# Patient Record
Sex: Female | Born: 1995 | Race: Asian | Hispanic: No | Marital: Married
Health system: Southern US, Community
[De-identification: ages and names within clinical notes are randomized; demographics above are authoritative.]

## PROBLEM LIST (undated history)

## (undated) DIAGNOSIS — Z789 Other specified health status: Secondary | ICD-10-CM

## (undated) DIAGNOSIS — O139 Gestational [pregnancy-induced] hypertension without significant proteinuria, unspecified trimester: Secondary | ICD-10-CM

## (undated) HISTORY — PX: NO PAST SURGERIES: SHX2092

## (undated) HISTORY — DX: Gestational (pregnancy-induced) hypertension without significant proteinuria, unspecified trimester: O13.9

## (undated) HISTORY — DX: Other specified health status: Z78.9

---

## 2019-08-17 ENCOUNTER — Other Ambulatory Visit: Payer: Self-pay

## 2019-08-17 ENCOUNTER — Encounter (HOSPITAL_COMMUNITY): Payer: Self-pay

## 2019-08-17 ENCOUNTER — Inpatient Hospital Stay (HOSPITAL_COMMUNITY)
Admission: AD | Admit: 2019-08-17 | Discharge: 2019-08-17 | Disposition: A | Discharge status: Home / Self Care | Payer: Medicaid Other | Attending: Obstetrics and Gynecology | Admitting: Obstetrics and Gynecology

## 2019-08-17 DIAGNOSIS — Z3202 Encounter for pregnancy test, result negative: Secondary | ICD-10-CM | POA: Diagnosis not present

## 2019-08-17 LAB — POCT PREGNANCY, URINE: Preg Test, Ur: NEGATIVE

## 2019-08-17 NOTE — MAU Note (Signed)
Pt complaining of vaginal bleeding that started at 3 pm. States it was less than a period. Having leg and abdominal pain as medium. Also having nausea and dizziness.

## 2019-08-17 NOTE — MAU Provider Note (Signed)
Mia Porter is a 23 y.o. G1P0  who presents to MAU today for vaginal bleeding, pregnancy sx, and missed menses (LMP 10/7). She has not taken a pregnancy test.   BP 129/85 (BP Location: Right Arm)   Pulse 88   Resp 18   Ht 5\' 5"  (1.651 m)   Wt 71.4 kg   LMP 07/01/2019   SpO2 100% Comment: room air  BMI 26.21 kg/m   CONSTITUTIONAL: Well-developed, well-nourished female in no acute distress.  MUSCULOSKELETAL: Normal range of motion.  CARDIOVASCULAR: Regular heart rate RESPIRATORY: Normal effort NEUROLOGICAL: Alert and oriented to person, place, and time.  SKIN: No pallor. PSYCH: Normal mood and affect. Normal behavior. Normal judgment and thought content.  Results for orders placed or performed during the hospital encounter of 08/17/19 (from the past 24 hour(s))  Pregnancy, urine POC     Status: None   Collection Time: 08/17/19  4:25 PM  Result Value Ref Range   Preg Test, Ur NEGATIVE NEGATIVE   MDM No signs of pregnancy. Discussed findings with pt. VB is likely a late menses.   A: Negative pregnancy test  P: Discharge home Follow up with OBGYN as needed- info provided for [place] Patient may return to MAU for OB emergencies as needed or if her condition were to change or worsen   Julianne Handler, [place]  08/17/2019 6:05 PM

## 2019-09-25 NOTE — L&D Delivery Note (Signed)
Delivery Note Labor onset:   06/09/20 Labor Onset Time: 1600 Complete dilation at 8:20 PM  Onset of pushing at 2044 FHR second stage Cat 1 Analgesia/Anesthesia intrapartum: Epidural  Open-glottis pushing with maternal urge. Delivery of a viable female at 2117. Fetal head delivered in OA position. Nuchal cord x1 loose delivered via somersault manuever. Infant placed on maternal abd, dried, and tactile stim.  Cord double clamped after pulsation ended and cut by father. Father present for birth. Cord blood sample collected Yes Arterial cord blood sample N/A.  Placenta delivered Tomasa Blase, intact, with 3 VC.  Placenta to pathology for GHTN. Uterine tone firm bleeding scant  1st degree laceration identified.  Anesthesia: Epidural Repair: 2-0 Vicryl CT EBL (mL): 100 Complications: none APGAR: APGAR (1 MIN): 8   APGAR (5 MINS): 9   APGAR (10 MINS):   Mom to postpartum.  Baby to Couplet care / Skin to Skin   Parents decline circumcision.  Roma Schanz MSN, CNM 06/09/2020, 9:44 PM

## 2019-12-17 ENCOUNTER — Other Ambulatory Visit (HOSPITAL_COMMUNITY): Payer: Self-pay | Admitting: Nurse Practitioner

## 2019-12-17 DIAGNOSIS — Z3A13 13 weeks gestation of pregnancy: Secondary | ICD-10-CM

## 2019-12-17 DIAGNOSIS — Z3682 Encounter for antenatal screening for nuchal translucency: Secondary | ICD-10-CM

## 2019-12-21 ENCOUNTER — Encounter (HOSPITAL_COMMUNITY): Payer: Self-pay | Admitting: *Deleted

## 2019-12-24 ENCOUNTER — Other Ambulatory Visit (HOSPITAL_COMMUNITY): Payer: Self-pay | Admitting: Nurse Practitioner

## 2019-12-24 ENCOUNTER — Ambulatory Visit (HOSPITAL_COMMUNITY): Payer: Medicaid Other | Admitting: *Deleted

## 2019-12-24 ENCOUNTER — Ambulatory Visit (HOSPITAL_COMMUNITY)
Admission: RE | Admit: 2019-12-24 | Discharge: 2019-12-24 | Disposition: A | Discharge status: Home / Self Care | Payer: Medicaid Other | Source: Ambulatory Visit | Attending: Nurse Practitioner | Admitting: Nurse Practitioner

## 2019-12-24 ENCOUNTER — Ambulatory Visit (HOSPITAL_COMMUNITY): Payer: Medicaid Other

## 2019-12-24 ENCOUNTER — Encounter (HOSPITAL_COMMUNITY): Payer: Self-pay

## 2019-12-24 ENCOUNTER — Other Ambulatory Visit: Payer: Self-pay

## 2019-12-24 VITALS — BP 126/80 | HR 100 | Temp 97.1°F | Ht 63.5 in | Wt 152.4 lb

## 2019-12-24 DIAGNOSIS — Z3682 Encounter for antenatal screening for nuchal translucency: Secondary | ICD-10-CM

## 2019-12-24 DIAGNOSIS — Z3A13 13 weeks gestation of pregnancy: Secondary | ICD-10-CM | POA: Diagnosis not present

## 2019-12-31 LAB — OB RESULTS CONSOLE HEPATITIS B SURFACE ANTIGEN: Hepatitis B Surface Ag: NEGATIVE

## 2020-01-27 ENCOUNTER — Other Ambulatory Visit: Payer: Self-pay | Admitting: Obstetrics and Gynecology

## 2020-01-27 DIAGNOSIS — Z363 Encounter for antenatal screening for malformations: Secondary | ICD-10-CM

## 2020-01-27 LAB — OB RESULTS CONSOLE GC/CHLAMYDIA
Chlamydia: NEGATIVE
Gonorrhea: NEGATIVE

## 2020-02-16 ENCOUNTER — Other Ambulatory Visit: Payer: Self-pay | Admitting: *Deleted

## 2020-02-16 ENCOUNTER — Other Ambulatory Visit: Payer: Self-pay | Admitting: Obstetrics and Gynecology

## 2020-02-16 ENCOUNTER — Ambulatory Visit: Payer: Medicaid Other | Attending: Obstetrics and Gynecology

## 2020-02-16 ENCOUNTER — Other Ambulatory Visit: Payer: Self-pay

## 2020-02-16 DIAGNOSIS — Z3A20 20 weeks gestation of pregnancy: Secondary | ICD-10-CM | POA: Diagnosis not present

## 2020-02-16 DIAGNOSIS — Z363 Encounter for antenatal screening for malformations: Secondary | ICD-10-CM

## 2020-02-16 DIAGNOSIS — O365920 Maternal care for other known or suspected poor fetal growth, second trimester, not applicable or unspecified: Secondary | ICD-10-CM | POA: Diagnosis not present

## 2020-03-08 ENCOUNTER — Other Ambulatory Visit: Payer: Self-pay | Admitting: *Deleted

## 2020-03-08 ENCOUNTER — Other Ambulatory Visit: Payer: Self-pay

## 2020-03-08 ENCOUNTER — Other Ambulatory Visit: Payer: Self-pay | Admitting: Obstetrics

## 2020-03-08 ENCOUNTER — Ambulatory Visit: Payer: Medicaid Other | Admitting: *Deleted

## 2020-03-08 ENCOUNTER — Ambulatory Visit: Payer: Medicaid Other | Attending: Obstetrics and Gynecology

## 2020-03-08 VITALS — BP 117/75 | HR 95

## 2020-03-08 DIAGNOSIS — O365990 Maternal care for other known or suspected poor fetal growth, unspecified trimester, not applicable or unspecified: Secondary | ICD-10-CM

## 2020-03-08 DIAGNOSIS — O322XX0 Maternal care for transverse and oblique lie, not applicable or unspecified: Secondary | ICD-10-CM | POA: Diagnosis not present

## 2020-03-08 DIAGNOSIS — O0990 Supervision of high risk pregnancy, unspecified, unspecified trimester: Secondary | ICD-10-CM

## 2020-03-08 DIAGNOSIS — O365920 Maternal care for other known or suspected poor fetal growth, second trimester, not applicable or unspecified: Secondary | ICD-10-CM | POA: Diagnosis not present

## 2020-03-08 DIAGNOSIS — Z362 Encounter for other antenatal screening follow-up: Secondary | ICD-10-CM | POA: Diagnosis not present

## 2020-03-08 DIAGNOSIS — Z3A23 23 weeks gestation of pregnancy: Secondary | ICD-10-CM | POA: Diagnosis not present

## 2020-03-22 ENCOUNTER — Other Ambulatory Visit: Payer: Self-pay | Admitting: *Deleted

## 2020-03-22 ENCOUNTER — Ambulatory Visit: Payer: Medicaid Other | Admitting: *Deleted

## 2020-03-22 ENCOUNTER — Other Ambulatory Visit: Payer: Self-pay

## 2020-03-22 ENCOUNTER — Ambulatory Visit: Payer: Medicaid Other | Attending: Obstetrics and Gynecology

## 2020-03-22 VITALS — BP 116/81 | HR 81

## 2020-03-22 DIAGNOSIS — O365990 Maternal care for other known or suspected poor fetal growth, unspecified trimester, not applicable or unspecified: Secondary | ICD-10-CM | POA: Insufficient documentation

## 2020-03-22 DIAGNOSIS — O365920 Maternal care for other known or suspected poor fetal growth, second trimester, not applicable or unspecified: Secondary | ICD-10-CM

## 2020-03-22 DIAGNOSIS — O0990 Supervision of high risk pregnancy, unspecified, unspecified trimester: Secondary | ICD-10-CM

## 2020-03-22 DIAGNOSIS — Z3A25 25 weeks gestation of pregnancy: Secondary | ICD-10-CM

## 2020-03-29 ENCOUNTER — Other Ambulatory Visit: Payer: Self-pay

## 2020-03-29 ENCOUNTER — Ambulatory Visit: Payer: Medicaid Other | Admitting: *Deleted

## 2020-03-29 ENCOUNTER — Ambulatory Visit: Payer: Medicaid Other | Attending: Obstetrics and Gynecology

## 2020-03-29 VITALS — BP 119/81 | HR 86

## 2020-03-29 DIAGNOSIS — O365990 Maternal care for other known or suspected poor fetal growth, unspecified trimester, not applicable or unspecified: Secondary | ICD-10-CM | POA: Diagnosis present

## 2020-03-29 DIAGNOSIS — Z3A26 26 weeks gestation of pregnancy: Secondary | ICD-10-CM | POA: Diagnosis not present

## 2020-03-29 DIAGNOSIS — Z362 Encounter for other antenatal screening follow-up: Secondary | ICD-10-CM | POA: Diagnosis not present

## 2020-03-29 DIAGNOSIS — O365920 Maternal care for other known or suspected poor fetal growth, second trimester, not applicable or unspecified: Secondary | ICD-10-CM | POA: Diagnosis present

## 2020-03-30 LAB — OB RESULTS CONSOLE HIV ANTIBODY (ROUTINE TESTING): HIV: NONREACTIVE

## 2020-04-05 ENCOUNTER — Ambulatory Visit: Payer: Medicaid Other | Attending: Family

## 2020-04-05 ENCOUNTER — Ambulatory Visit: Payer: Self-pay

## 2020-04-05 DIAGNOSIS — Z23 Encounter for immunization: Secondary | ICD-10-CM

## 2020-04-05 NOTE — Progress Notes (Signed)
   Covid-19 Vaccination Clinic  Name:  Mia Porter    MRN: [id number] DOB: [birth year]  04/05/2020  Mia Porter was observed post Covid-19 immunization for 15 minutes without incident. She was provided with Vaccine Information Sheet and instruction to access the V-Safe system.   Mia Porter was instructed to call 911 with any severe reactions post vaccine: Marland Kitchen Difficulty breathing  . Swelling of face and throat  . A fast heartbeat  . A bad rash all over body  . Dizziness and weakness   Immunizations Administered    Name Date Dose VIS Date Route   Moderna COVID-19 Vaccine 04/05/2020  2:53 PM 0.5 mL 08/2019 Intramuscular   Manufacturer: Moderna   Lot: [id number]   NDC: 62229-798-92

## 2020-04-06 ENCOUNTER — Ambulatory Visit: Payer: Medicaid Other

## 2020-04-19 ENCOUNTER — Other Ambulatory Visit: Payer: Self-pay | Admitting: *Deleted

## 2020-04-19 ENCOUNTER — Ambulatory Visit: Payer: Medicaid Other | Admitting: *Deleted

## 2020-04-19 ENCOUNTER — Ambulatory Visit: Payer: Medicaid Other | Attending: Obstetrics and Gynecology

## 2020-04-19 ENCOUNTER — Other Ambulatory Visit: Payer: Self-pay

## 2020-04-19 VITALS — BP 124/85 | HR 64

## 2020-04-19 DIAGNOSIS — Z362 Encounter for other antenatal screening follow-up: Secondary | ICD-10-CM | POA: Diagnosis not present

## 2020-04-19 DIAGNOSIS — O365930 Maternal care for other known or suspected poor fetal growth, third trimester, not applicable or unspecified: Secondary | ICD-10-CM

## 2020-04-19 DIAGNOSIS — Z3A29 29 weeks gestation of pregnancy: Secondary | ICD-10-CM

## 2020-04-19 DIAGNOSIS — O365920 Maternal care for other known or suspected poor fetal growth, second trimester, not applicable or unspecified: Secondary | ICD-10-CM | POA: Diagnosis present

## 2020-05-03 ENCOUNTER — Ambulatory Visit: Payer: Medicaid Other | Attending: Internal Medicine

## 2020-05-03 DIAGNOSIS — Z23 Encounter for immunization: Secondary | ICD-10-CM

## 2020-05-03 NOTE — Progress Notes (Signed)
   Covid-19 Vaccination Clinic  Name:  Mia Porter    MRN: [id number] DOB: 09-08-1996  05/03/2020  [name] was observed post Covid-19 immunization for 15 minutes without incident. She was provided with Vaccine Information Sheet and instruction to access the V-Safe system.   [name] was instructed to call 911 with any severe reactions post vaccine: Marland Kitchen Difficulty breathing  . Swelling of face and throat  . A fast heartbeat  . A bad rash all over body  . Dizziness and weakness   Immunizations Administered    Name Date Dose VIS Date Route   Moderna COVID-19 Vaccine 05/03/2020 11:37 AM 0.5 mL 08/2019 Intramuscular   Manufacturer: Moderna   Lot: [id number]   NDC: 36629-476-54

## 2020-05-10 ENCOUNTER — Other Ambulatory Visit: Payer: Self-pay | Admitting: *Deleted

## 2020-05-10 ENCOUNTER — Ambulatory Visit: Payer: Medicaid Other | Attending: Obstetrics and Gynecology

## 2020-05-10 ENCOUNTER — Encounter: Payer: Self-pay | Admitting: *Deleted

## 2020-05-10 ENCOUNTER — Ambulatory Visit: Payer: Medicaid Other | Admitting: *Deleted

## 2020-05-10 ENCOUNTER — Other Ambulatory Visit: Payer: Self-pay

## 2020-05-10 VITALS — BP 131/82 | HR 84

## 2020-05-10 DIAGNOSIS — Z3A32 32 weeks gestation of pregnancy: Secondary | ICD-10-CM

## 2020-05-10 DIAGNOSIS — Z362 Encounter for other antenatal screening follow-up: Secondary | ICD-10-CM | POA: Diagnosis not present

## 2020-05-10 DIAGNOSIS — O365990 Maternal care for other known or suspected poor fetal growth, unspecified trimester, not applicable or unspecified: Secondary | ICD-10-CM | POA: Insufficient documentation

## 2020-05-10 DIAGNOSIS — O365930 Maternal care for other known or suspected poor fetal growth, third trimester, not applicable or unspecified: Secondary | ICD-10-CM

## 2020-06-02 ENCOUNTER — Other Ambulatory Visit: Payer: Self-pay | Admitting: Obstetrics

## 2020-06-02 ENCOUNTER — Other Ambulatory Visit: Payer: Self-pay | Admitting: *Deleted

## 2020-06-02 ENCOUNTER — Other Ambulatory Visit: Payer: Self-pay

## 2020-06-02 ENCOUNTER — Inpatient Hospital Stay (HOSPITAL_COMMUNITY)
Admission: AD | Admit: 2020-06-02 | Discharge: 2020-06-02 | Disposition: A | Discharge status: Home / Self Care | Payer: Medicaid Other | Attending: Obstetrics and Gynecology | Admitting: Obstetrics and Gynecology

## 2020-06-02 ENCOUNTER — Encounter (HOSPITAL_COMMUNITY): Payer: Self-pay | Admitting: Obstetrics and Gynecology

## 2020-06-02 ENCOUNTER — Ambulatory Visit: Payer: Medicaid Other | Attending: Obstetrics and Gynecology

## 2020-06-02 ENCOUNTER — Ambulatory Visit: Payer: Medicaid Other

## 2020-06-02 DIAGNOSIS — O365990 Maternal care for other known or suspected poor fetal growth, unspecified trimester, not applicable or unspecified: Secondary | ICD-10-CM

## 2020-06-02 DIAGNOSIS — Z362 Encounter for other antenatal screening follow-up: Secondary | ICD-10-CM | POA: Diagnosis not present

## 2020-06-02 DIAGNOSIS — O365930 Maternal care for other known or suspected poor fetal growth, third trimester, not applicable or unspecified: Secondary | ICD-10-CM

## 2020-06-02 DIAGNOSIS — Z3A36 36 weeks gestation of pregnancy: Secondary | ICD-10-CM | POA: Diagnosis not present

## 2020-06-02 DIAGNOSIS — O133 Gestational [pregnancy-induced] hypertension without significant proteinuria, third trimester: Secondary | ICD-10-CM | POA: Insufficient documentation

## 2020-06-02 DIAGNOSIS — O139 Gestational [pregnancy-induced] hypertension without significant proteinuria, unspecified trimester: Secondary | ICD-10-CM | POA: Diagnosis present

## 2020-06-02 LAB — COMPREHENSIVE METABOLIC PANEL
ALT: 15 U/L (ref 0–44)
AST: 19 U/L (ref 15–41)
Albumin: 2.8 g/dL — ABNORMAL LOW (ref 3.5–5.0)
Alkaline Phosphatase: 130 U/L — ABNORMAL HIGH (ref 38–126)
Anion gap: 9 (ref 5–15)
BUN: 5 mg/dL — ABNORMAL LOW (ref 6–20)
CO2: 21 mmol/L — ABNORMAL LOW (ref 22–32)
Calcium: 9.2 mg/dL (ref 8.9–10.3)
Chloride: 105 mmol/L (ref 98–111)
Creatinine, Ser: 0.46 mg/dL (ref 0.44–1.00)
GFR calc Af Amer: >60 mL/min (ref >60)
GFR calc non Af Amer: >60 mL/min (ref >60)
Glucose, Bld: 89 mg/dL (ref 70–99)
Potassium: 4.1 mmol/L (ref 3.5–5.1)
Sodium: 135 mmol/L (ref 135–145)
Total Bilirubin: 0.4 mg/dL (ref 0.3–1.2)
Total Protein: 6.1 g/dL — ABNORMAL LOW (ref 6.5–8.1)

## 2020-06-02 LAB — PROTEIN / CREATININE RATIO, URINE
Creatinine, Urine: 23.7 mg/dL
Total Protein, Urine: <6 mg/dL

## 2020-06-02 LAB — LACTATE DEHYDROGENASE: LDH: 124 U/L (ref 98–192)

## 2020-06-02 LAB — CBC
HCT: 33.3 % — ABNORMAL LOW (ref 36.0–46.0)
Hemoglobin: 10.3 g/dL — ABNORMAL LOW (ref 12.0–15.0)
MCH: 25.5 pg — ABNORMAL LOW (ref 26.0–34.0)
MCHC: 30.9 g/dL (ref 30.0–36.0)
MCV: 82.4 fL (ref 80.0–100.0)
Platelets: 163 10*3/uL (ref 150–400)
RBC: 4.04 MIL/uL (ref 3.87–5.11)
RDW: 18 % — ABNORMAL HIGH (ref 11.5–15.5)
WBC: 6.2 10*3/uL (ref 4.0–10.5)
nRBC: 0 % (ref 0.0–0.2)

## 2020-06-02 LAB — URIC ACID: Uric Acid, Serum: 4.1 mg/dL (ref 2.5–7.1)

## 2020-06-02 LAB — URINALYSIS, ROUTINE W REFLEX MICROSCOPIC
Bilirubin Urine: NEGATIVE
Glucose, UA: NEGATIVE mg/dL
Hgb urine dipstick: NEGATIVE
Ketones, ur: NEGATIVE mg/dL
Leukocytes,Ua: NEGATIVE
Nitrite: NEGATIVE
Protein, ur: NEGATIVE mg/dL
Specific Gravity, Urine: 1.004 — ABNORMAL LOW (ref 1.005–1.030)
pH: 7 (ref 5.0–8.0)

## 2020-06-02 MED ORDER — LACTATED RINGERS IV SOLN: INTRAVENOUS | Status: DC

## 2020-06-02 MED ORDER — LABETALOL HCL 5 MG/ML IV SOLN
INTRAVENOUS | Status: AC
  Administered 2020-06-02: 20 mg via INTRAVENOUS
  Filled 2020-06-02: qty 4

## 2020-06-02 MED ORDER — LABETALOL HCL 5 MG/ML IV SOLN: 20.0000 mg | INTRAVENOUS | Status: DC | PRN

## 2020-06-02 MED ORDER — NIFEDIPINE ER OSMOTIC RELEASE 30 MG PO TB24
30.0000 mg | ORAL_TABLET | Freq: Every day | ORAL | Status: DC
  Administered 2020-06-02: 30 mg via ORAL
  Filled 2020-06-02: qty 1

## 2020-06-02 MED ORDER — LABETALOL HCL 5 MG/ML IV SOLN: 80.0000 mg | INTRAVENOUS | Status: DC | PRN

## 2020-06-02 MED ORDER — BETAMETHASONE SOD PHOS & ACET 6 (3-3) MG/ML IJ SUSP
12.0000 mg | INTRAMUSCULAR | Status: DC
  Administered 2020-06-02: 12 mg via INTRAMUSCULAR
  Filled 2020-06-02: qty 5

## 2020-06-02 MED ORDER — LABETALOL HCL 5 MG/ML IV SOLN: 40.0000 mg | INTRAVENOUS | Status: DC | PRN

## 2020-06-02 MED ORDER — HYDRALAZINE HCL 20 MG/ML IJ SOLN: 10.0000 mg | INTRAMUSCULAR | Status: DC | PRN

## 2020-06-02 MED ORDER — NIFEDIPINE ER 30 MG PO TB24: 30.0000 mg | ORAL_TABLET | Freq: Every day | ORAL | 1 refills | Status: AC

## 2020-06-02 NOTE — MAU Note (Signed)
Sent from MD for BP evaluation.  Denies visual disturbances, visual disturbances, and epigastric pain.  Endorses +FM.

## 2020-06-02 NOTE — Discharge Instructions (Signed)

## 2020-06-02 NOTE — MAU Provider Note (Signed)
History    Mia Porter is a 24 y.o. G1P0 at [redacted]w[redacted]d sent by office for evaluation of elevated BP.  Patient seen by MFM today for growth sono, noted IUGR 9%tile.   Denies HA/NV/RUQ pain/visual changes.   Spouse present and supportive.   CSN: [id number]  Arrival date and time: 06/02/20 1456   None     Chief Complaint  Patient presents with  . BP Evaluation   HPI  OB History    Gravida  1   Para      Term      Preterm      AB      Living  0     SAB      TAB      Ectopic      Multiple      Live Births              Past Medical History:  Diagnosis Date  . Medical history non-contributory     Past Surgical History:  Procedure Laterality Date  . NO PAST SURGERIES      History reviewed. No pertinent family history.  Social History   Tobacco Use  . Smoking status: Never Smoker  . Smokeless tobacco: Never Used  Vaping Use  . Vaping Use: Never used  Substance Use Topics  . Alcohol use: Not Currently  . Drug use: Not Currently    Allergies: No Known Allergies  Medications Prior to Admission  Medication Sig Dispense Refill Last Dose  . ferrous sulfate 325 (65 FE) MG EC tablet Take 325 mg by mouth 3 (three) times daily with meals.   06/02/2020 at Unknown time  . Prenatal Vit w/Fe-Methylfol-FA (PNV PO) Take by mouth.   06/02/2020 at Unknown time    Review of Systems Physical Exam   Patient Vitals for the past 24 hrs:  BP Temp Temp src Pulse Resp SpO2 Height Weight  06/02/20 1956 (!) 151/99 -- -- 97 17 100 % -- --  06/02/20 1930 (!) 143/94 -- -- 87 -- -- -- --  06/02/20 1900 (!) 143/102 -- -- 94 -- -- -- --  06/02/20 1830 (!) 140/103 -- -- 91 -- 99 % -- --  06/02/20 1800 (!) 149/101 -- -- 84 -- -- -- --  06/02/20 1730 (!) 144/96 -- -- 86 -- -- -- --  06/02/20 1713 (!) 148/101 -- -- 82 -- -- -- --  06/02/20 1700 (!) 146/100 -- -- 82 -- -- -- --  06/02/20 1650 (!) 148/99 -- -- 78 -- -- -- --  06/02/20 1640 (!) 146/100 -- -- 84 -- -- -- --   06/02/20 1630 (!) 153/98 -- -- 84 -- -- -- --  06/02/20 1620 (!) 147/99 -- -- 83 -- -- -- --  06/02/20 1610 (!) 155/97 -- -- 81 -- 100 % -- --  06/02/20 1605 (!) 156/106 -- -- 82 -- -- -- --  06/02/20 1545 (!) 150/110 -- -- 89 -- 100 % -- --  06/02/20 1542 (!) 161/107 -- -- 91 -- -- -- --  06/02/20 1518 (!) 148/110 98.6 F (37 C) Oral 93 20 93 % 5\' 4"  (1.626 m) 83.4 kg   Physical Exam  MAU Course  Procedures . Orders Placed This Encounter  Procedures  . CBC    Standing Status:   Standing    Number of Occurrences:   1  . Protein / creatinine ratio, urine    Standing Status:   Standing    Number  of Occurrences:   1  . Urinalysis, Routine w reflex microscopic Urine, Clean Catch    Standing Status:   Standing    Number of Occurrences:   1  . Uric acid    Standing Status:   Standing    Number of Occurrences:   1  . Lactate dehydrogenase    Standing Status:   Standing    Number of Occurrences:   1  . Comprehensive metabolic panel    Standing Status:   Standing    Number of Occurrences:   1  . Notify Physician    Confirmatory reading of BP> 160/110 15 minutes later    Standing Status:   Standing    Number of Occurrences:   1    Order Specific Question:   Notify Physician    Answer:   Temp greater than or equal to 100.4    Order Specific Question:   Notify Physician    Answer:   RR greater than 24 or less than 10    Order Specific Question:   Notify Physician    Answer:   HR greater than 120 or less than 50    Order Specific Question:   Notify Physician    Answer:   SBP greater than 160 mmHG or less than 80 mmHG    Order Specific Question:   Notify Physician    Answer:   DBP greater than 110 mmHG or less than 45 mmHG    Order Specific Question:   Notify Physician    Answer:   Urinary output is less than for any 4 hour period  . Measure blood pressure    20 minutes after giving hydralazine 10 MG IV dose.  Call MD if SBP >/= 160 or DBP >/= 110.    Standing Status:    Standing    Number of Occurrences:   1  . Discharge patient    Order Specific Question:   Discharge disposition    Answer:   01-Home or Self Care [1]    Order Specific Question:   Discharge patient date    Answer:   06/02/2020   Meds ordered this encounter  Medications  . AND Linked Order Group   . labetalol (NORMODYNE) injection 20 mg   . labetalol (NORMODYNE) injection 40 mg   . labetalol (NORMODYNE) injection 80 mg   . hydrALAZINE (APRESOLINE) injection 10 mg  . labetalol (NORMODYNE) 5 MG/ML injection    [place], [place]   : cabinet override  . lactated ringers infusion  . betamethasone acetate-betamethasone sodium phosphate (CELESTONE) injection 12 mg  . NIFEdipine (PROCARDIA-XL/NIFEDICAL-XL) 24 hr tablet 30 mg  . NIFEdipine (ADALAT CC) 30 MG 24 hr tablet    Sig: Take 1 tablet (30 mg total) by mouth daily.    Dispense:  30 tablet    Refill:  1   MDM Patient evaluated for elevated BP w/o neural symptoms. No evidence of preeclampsia at this time.  Fetal heart tracing reactive, category 1  Results for orders placed or performed during the hospital encounter of 06/02/20 (from the past 24 hour(s))  Urinalysis, Routine w reflex microscopic     Status: Abnormal   Collection Time: 06/02/20  3:23 PM  Result Value Ref Range   Color, Urine STRAW (A) YELLOW   APPearance CLEAR CLEAR   Specific Gravity, Urine 1.004 (L) 1.005 - 1.030   pH 7.0 5.0 - 8.0   Glucose, UA NEGATIVE NEGATIVE mg/dL   Hgb urine dipstick NEGATIVE  NEGATIVE   Bilirubin Urine NEGATIVE NEGATIVE   Ketones, ur NEGATIVE NEGATIVE mg/dL   Protein, ur NEGATIVE NEGATIVE mg/dL   Nitrite NEGATIVE NEGATIVE   Leukocytes,Ua NEGATIVE NEGATIVE  CBC     Status: Abnormal   Collection Time: 06/02/20  3:29 PM  Result Value Ref Range   WBC 6.2 4.0 - 10.5 K/uL   RBC 4.04 3.87 - 5.11 MIL/uL   Hemoglobin 10.3 (L) 12.0 - 15.0 g/dL   HCT 08.6 (L) 36 - 46 %   MCV 82.4 80.0 - 100.0 fL   MCH 25.5 (L) 26.0 - 34.0 pg   MCHC 30.9 30.0 -  36.0 g/dL   RDW 76.1 (H) 95.0 - 93.2 %   Platelets 163 150 - 400 K/uL   nRBC 0.0 0.0 - 0.2 %  Protein / creatinine ratio, urine     Status: None   Collection Time: 06/02/20  3:29 PM  Result Value Ref Range   Creatinine, Urine 23.70 mg/dL   Total Protein, Urine <6 mg/dL   Protein Creatinine Ratio        0.00 - 0.15 mg/mg[Cre]  Uric acid     Status: None   Collection Time: 06/02/20  3:37 PM  Result Value Ref Range   Uric Acid, Serum 4.1 2.5 - 7.1 mg/dL  Lactate dehydrogenase     Status: None   Collection Time: 06/02/20  3:37 PM  Result Value Ref Range   LDH 124 98 - 192 U/L  Comprehensive metabolic panel     Status: Abnormal   Collection Time: 06/02/20  3:37 PM  Result Value Ref Range   Sodium 135 135 - 145 mmol/L   Potassium 4.1 3.5 - 5.1 mmol/L   Chloride 105 98 - 111 mmol/L   CO2 21 (L) 22 - 32 mmol/L   Glucose, Bld 89 70 - 99 mg/dL   BUN 5 (L) 6 - 20 mg/dL   Creatinine, Ser 6.71 0.44 - 1.00 mg/dL   Calcium 9.2 8.9 - 24.5 mg/dL   Total Protein 6.1 (L) 6.5 - 8.1 g/dL   Albumin 2.8 (L) 3.5 - 5.0 g/dL   AST 19 15 - 41 U/L   ALT 15 0 - 44 U/L   Alkaline Phosphatase 130 (H) 38 - 126 U/L   Total Bilirubin 0.4 0.3 - 1.2 mg/dL   GFR calc non Af Amer >60 >60 mL/min   GFR calc Af Amer >60 >60 mL/min   Anion gap 9 5 - 15    Assessment and Plan  G1P0 at [redacted]w[redacted]d  Gestational hypertension  BP in mild elevated range, preeclampsia lab workup negative.  Will give betamethasone 2 doses 24 hrs apart for fetal lung maturation, patient to return tomorrow for second dose  Procardia 30 XL daily Scheduled for IOL 06/08/2020  PEC precautions reviewed with patient and spouse. Written information given and all questions answered.  DC home after medications given. F/U with office visit on Monday, patient to call and schedule visit.   POC in consult w/ Dr. Maryan Puls, cnm 06/02/2020, 7:47 PM

## 2020-06-03 ENCOUNTER — Telehealth (HOSPITAL_COMMUNITY): Payer: Self-pay | Admitting: *Deleted

## 2020-06-03 ENCOUNTER — Other Ambulatory Visit: Payer: Self-pay

## 2020-06-03 ENCOUNTER — Inpatient Hospital Stay (HOSPITAL_COMMUNITY)
Admission: AD | Admit: 2020-06-03 | Discharge: 2020-06-03 | Disposition: A | Discharge status: Home / Self Care | Payer: Medicaid Other | Attending: Obstetrics and Gynecology | Admitting: Obstetrics and Gynecology

## 2020-06-03 ENCOUNTER — Encounter (HOSPITAL_COMMUNITY): Payer: Self-pay | Admitting: *Deleted

## 2020-06-03 DIAGNOSIS — O133 Gestational [pregnancy-induced] hypertension without significant proteinuria, third trimester: Secondary | ICD-10-CM | POA: Insufficient documentation

## 2020-06-03 DIAGNOSIS — Z3A37 37 weeks gestation of pregnancy: Secondary | ICD-10-CM | POA: Diagnosis not present

## 2020-06-03 MED ORDER — BETAMETHASONE SOD PHOS & ACET 6 (3-3) MG/ML IJ SUSP
12.0000 mg | Freq: Once | INTRAMUSCULAR | Status: AC
  Administered 2020-06-03: 12 mg via INTRAMUSCULAR

## 2020-06-03 NOTE — Telephone Encounter (Signed)
Preadmission screen  

## 2020-06-07 ENCOUNTER — Other Ambulatory Visit (HOSPITAL_COMMUNITY): Payer: Medicaid Other | Attending: Obstetrics and Gynecology

## 2020-06-08 ENCOUNTER — Encounter (HOSPITAL_COMMUNITY): Payer: Self-pay | Admitting: Obstetrics and Gynecology

## 2020-06-08 ENCOUNTER — Ambulatory Visit: Payer: Medicaid Other

## 2020-06-08 ENCOUNTER — Other Ambulatory Visit: Payer: Self-pay

## 2020-06-08 ENCOUNTER — Inpatient Hospital Stay (HOSPITAL_COMMUNITY)
Admission: AD | Admit: 2020-06-08 | Discharge: 2020-06-11 | DRG: 807 | Disposition: A | Discharge status: Home / Self Care | Payer: Medicaid Other | Attending: Obstetrics & Gynecology | Admitting: Obstetrics & Gynecology

## 2020-06-08 DIAGNOSIS — Z3A37 37 weeks gestation of pregnancy: Secondary | ICD-10-CM | POA: Diagnosis not present

## 2020-06-08 DIAGNOSIS — O99214 Obesity complicating childbirth: Secondary | ICD-10-CM | POA: Diagnosis present

## 2020-06-08 DIAGNOSIS — O134 Gestational [pregnancy-induced] hypertension without significant proteinuria, complicating childbirth: Secondary | ICD-10-CM | POA: Diagnosis present

## 2020-06-08 DIAGNOSIS — E669 Obesity, unspecified: Secondary | ICD-10-CM | POA: Diagnosis present

## 2020-06-08 DIAGNOSIS — O139 Gestational [pregnancy-induced] hypertension without significant proteinuria, unspecified trimester: Secondary | ICD-10-CM | POA: Diagnosis present

## 2020-06-08 DIAGNOSIS — Z20822 Contact with and (suspected) exposure to covid-19: Secondary | ICD-10-CM | POA: Diagnosis present

## 2020-06-08 LAB — COMPREHENSIVE METABOLIC PANEL
ALT: 20 U/L (ref 0–44)
ALT: 18 U/L (ref 0–44)
AST: 22 U/L (ref 15–41)
AST: 21 U/L (ref 15–41)
Albumin: 2.8 g/dL — ABNORMAL LOW (ref 3.5–5.0)
Albumin: 2.5 g/dL — ABNORMAL LOW (ref 3.5–5.0)
Alkaline Phosphatase: 124 U/L (ref 38–126)
Alkaline Phosphatase: 121 U/L (ref 38–126)
Anion gap: 14 (ref 5–15)
Anion gap: 12 (ref 5–15)
BUN: 7 mg/dL (ref 6–20)
BUN: 6 mg/dL (ref 6–20)
CO2: 17 mmol/L — ABNORMAL LOW (ref 22–32)
CO2: 18 mmol/L — ABNORMAL LOW (ref 22–32)
Calcium: 9.9 mg/dL (ref 8.9–10.3)
Calcium: 9.4 mg/dL (ref 8.9–10.3)
Chloride: 105 mmol/L (ref 98–111)
Chloride: 104 mmol/L (ref 98–111)
Creatinine, Ser: 0.6 mg/dL (ref 0.44–1.00)
Creatinine, Ser: 0.54 mg/dL (ref 0.44–1.00)
GFR calc Af Amer: >60 mL/min (ref >60)
GFR calc Af Amer: >60 mL/min (ref >60)
GFR calc non Af Amer: >60 mL/min (ref >60)
GFR calc non Af Amer: >60 mL/min (ref >60)
Glucose, Bld: 71 mg/dL (ref 70–99)
Glucose, Bld: 131 mg/dL — ABNORMAL HIGH (ref 70–99)
Potassium: 4.3 mmol/L (ref 3.5–5.1)
Potassium: 3.6 mmol/L (ref 3.5–5.1)
Sodium: 136 mmol/L (ref 135–145)
Sodium: 134 mmol/L — ABNORMAL LOW (ref 135–145)
Total Bilirubin: 0.4 mg/dL (ref 0.3–1.2)
Total Bilirubin: 0.3 mg/dL (ref 0.3–1.2)
Total Protein: 6.3 g/dL — ABNORMAL LOW (ref 6.5–8.1)
Total Protein: 5.7 g/dL — ABNORMAL LOW (ref 6.5–8.1)

## 2020-06-08 LAB — PROTEIN / CREATININE RATIO, URINE
Creatinine, Urine: 65.01 mg/dL
Protein Creatinine Ratio: 0.28 mg/mg{Cre} — ABNORMAL HIGH (ref 0.00–0.15)
Total Protein, Urine: 18 mg/dL

## 2020-06-08 LAB — URINALYSIS, ROUTINE W REFLEX MICROSCOPIC
Bilirubin Urine: NEGATIVE
Glucose, UA: NEGATIVE mg/dL
Hgb urine dipstick: NEGATIVE
Ketones, ur: NEGATIVE mg/dL
Leukocytes,Ua: NEGATIVE
Nitrite: NEGATIVE
Protein, ur: NEGATIVE mg/dL
Specific Gravity, Urine: 1.008 (ref 1.005–1.030)
pH: 7 (ref 5.0–8.0)

## 2020-06-08 LAB — POCT FERN TEST: POCT Fern Test: NEGATIVE

## 2020-06-08 LAB — GROUP B STREP BY PCR: Group B strep by PCR: NEGATIVE

## 2020-06-08 LAB — CBC
HCT: 36.3 % (ref 36.0–46.0)
Hemoglobin: 11.4 g/dL — ABNORMAL LOW (ref 12.0–15.0)
MCH: 26.3 pg (ref 26.0–34.0)
MCHC: 31.4 g/dL (ref 30.0–36.0)
MCV: 83.8 fL (ref 80.0–100.0)
Platelets: 202 10*3/uL (ref 150–400)
RBC: 4.33 MIL/uL (ref 3.87–5.11)
RDW: 18.6 % — ABNORMAL HIGH (ref 11.5–15.5)
WBC: 7 10*3/uL (ref 4.0–10.5)
nRBC: 0 % (ref 0.0–0.2)

## 2020-06-08 LAB — TYPE AND SCREEN
ABO/RH(D): B POS
Antibody Screen: NEGATIVE

## 2020-06-08 LAB — SARS CORONAVIRUS 2 BY RT PCR (HOSPITAL ORDER, PERFORMED IN ~~LOC~~ HOSPITAL LAB): SARS Coronavirus 2: NEGATIVE

## 2020-06-08 MED ORDER — OXYTOCIN BOLUS FROM INFUSION
333.0000 mL | Freq: Once | INTRAVENOUS | Status: AC
  Administered 2020-06-09: 333 mL via INTRAVENOUS

## 2020-06-08 MED ORDER — FENTANYL CITRATE (PF) 100 MCG/2ML IJ SOLN: 50.0000 ug | INTRAMUSCULAR | Status: DC | PRN

## 2020-06-08 MED ORDER — HYDRALAZINE HCL 20 MG/ML IJ SOLN: 10.0000 mg | INTRAMUSCULAR | Status: DC | PRN

## 2020-06-08 MED ORDER — LABETALOL HCL 5 MG/ML IV SOLN
20.0000 mg | INTRAVENOUS | Status: DC | PRN
  Administered 2020-06-08 – 2020-06-09 (×4): 20 mg via INTRAVENOUS
  Filled 2020-06-08 (×4): qty 4

## 2020-06-08 MED ORDER — NIFEDIPINE ER OSMOTIC RELEASE 30 MG PO TB24
30.0000 mg | ORAL_TABLET | Freq: Every day | ORAL | Status: DC
  Administered 2020-06-08 – 2020-06-09 (×2): 30 mg via ORAL
  Filled 2020-06-08 (×2): qty 1

## 2020-06-08 MED ORDER — HYDROXYZINE HCL 50 MG PO TABS: 50.0000 mg | ORAL_TABLET | Freq: Four times a day (QID) | ORAL | Status: DC | PRN

## 2020-06-08 MED ORDER — LIDOCAINE HCL (PF) 1 % IJ SOLN: 30.0000 mL | INTRAMUSCULAR | Status: DC | PRN

## 2020-06-08 MED ORDER — MISOPROSTOL 50MCG HALF TABLET
50.0000 ug | ORAL_TABLET | ORAL | Status: DC | PRN
  Administered 2020-06-08 – 2020-06-09 (×3): 50 ug via ORAL
  Filled 2020-06-08 (×3): qty 1

## 2020-06-08 MED ORDER — NIFEDIPINE ER OSMOTIC RELEASE 30 MG PO TB24: 30.0000 mg | ORAL_TABLET | Freq: Every day | ORAL | Status: DC

## 2020-06-08 MED ORDER — LABETALOL HCL 5 MG/ML IV SOLN
40.0000 mg | INTRAVENOUS | Status: DC | PRN
  Administered 2020-06-09: 40 mg via INTRAVENOUS
  Filled 2020-06-08 (×2): qty 8

## 2020-06-08 MED ORDER — OXYTOCIN 10 UNIT/ML IJ SOLN: 10.0000 [IU] | Freq: Once | INTRAMUSCULAR | Status: DC

## 2020-06-08 MED ORDER — SOD CITRATE-CITRIC ACID 500-334 MG/5ML PO SOLN: 30.0000 mL | ORAL | Status: DC | PRN

## 2020-06-08 MED ORDER — TERBUTALINE SULFATE 1 MG/ML IJ SOLN: 0.2500 mg | Freq: Once | INTRAMUSCULAR | Status: DC | PRN

## 2020-06-08 MED ORDER — LABETALOL HCL 5 MG/ML IV SOLN
80.0000 mg | INTRAVENOUS | Status: DC | PRN
  Filled 2020-06-08: qty 16

## 2020-06-08 MED ORDER — OXYTOCIN-SODIUM CHLORIDE 30-0.9 UT/500ML-% IV SOLN
2.5000 [IU]/h | INTRAVENOUS | Status: DC
  Filled 2020-06-08: qty 500

## 2020-06-08 MED ORDER — ONDANSETRON HCL 4 MG/2ML IJ SOLN: 4.0000 mg | Freq: Four times a day (QID) | INTRAMUSCULAR | Status: DC | PRN

## 2020-06-08 MED ORDER — LACTATED RINGERS IV SOLN: INTRAVENOUS | Status: DC

## 2020-06-08 MED ORDER — ACETAMINOPHEN 325 MG PO TABS: 650.0000 mg | ORAL_TABLET | ORAL | Status: DC | PRN

## 2020-06-08 MED ORDER — LACTATED RINGERS IV SOLN: 500.0000 mL | INTRAVENOUS | Status: DC | PRN

## 2020-06-08 NOTE — Progress Notes (Signed)
Informed provider of severe range blood pressure at 1831 (163/108). Provider said to recheck before giving Labetalol. BP recheck was 159/107.

## 2020-06-08 NOTE — Progress Notes (Signed)
Labor Progress Note  Subjective: Pt resting quietly in bed with husband at bedside. Reviewed POC, pt denies questions. Pt denies HA, RUQ pain or vision changes. Pt endorses feeling cxt but able to breath through them.  Patient Active Problem List   Diagnosis Date Noted  . Gestational hypertension 06/02/2020   Objective: BP (!) 145/91   Pulse 78   Temp 98.3 F (36.8 C) (Oral)   Resp 18   Wt 79.8 kg   LMP 09/23/2019 (Exact Date)   SpO2 99%   BMI 30.21 kg/m  No intake/output data recorded. No intake/output data recorded. NST: FHR baseline 130 bpm, Variability: moderate, Accelerations:present, Decelerations:  Absent= Cat 1/Reactive CTX:  irregular, every 4-58mins to occ, lasting 20-40 seconds Uterus gravid, soft non tender, mild to palpate with contractions.  SVE:  Dilation:  (pt unable to tolerate SVE) Exam by:: Philemon Kingdom NP  Pt declined vaginal check.  Bedside US verified vertex position.   Assessment:  Mia Porter is a 24 y.o. female, G1P0000, IUP at 37.1 weeks, presenting for IOL for GHTN uncontrolled with medication procardia 30XL mg daily, with severe range BP 160/110s in MAU that required x1 dose IV labetalol, BP repeat was 135/91. Pt denies taking her BP medication today, she endorses taking it at night times.  Pt denies HA, RUQ pain or vision changes. Pt endorse + Fm. Denies vaginal leakage. Denies vaginal bleeding. Denies feeling cxt's. Interpretor used.   Prenatal Problem: Aemia (Hgb 9.6 at 27 weeks, rx'd Fe) Fetal growth restriction (EFW 7%ile at 21 weeks, primarily due to shortenend long bones, normal Panorama. F/U growth at 24 weeks 4.9%ile, started weekly dopplers 6/15, follow growth. RECHECK GROWTH 7/27 21%ILE, RECHECK IN 3 WEEKS AT MFM--EFW 13%ILE. RECHECK IN 3 WEEKS,  EFW 13% on 8/17, BPP 8/8, and Normal S/D ration on dopplers, EFW 5.3lbs 9% on 9/9)  Language difficulty (Husband as translator, speaks Louie Casa ([place]), understands some  English.) Pregnancy-induced hypertension (Dx 9/9, on Procardia 30mg  XL, scheduled for induction 06/09/20, labs negative on 9/9, PCR undetected.)  Declined vaginal exam Patient Active Problem List   Diagnosis Date Noted  . Gestational hypertension 06/02/2020   NICHD: Category 1  Membranes: Intact, no s/s of infection  Induction:    Cytotec x 08/02/2020 PO @ 2221, and 0349  Foley Bulb: N/A  Pitocin - N/A  Pain management:               IV pain management: xPRN             Epidural placement: PRN  GBS Negative  GHTN: BP 145/91, asymptomatic, required x1 dose IV labetalol upon admission @ 1242 on 9/15, neg labs, PCR 0.28,  stable.    Plan: Continue labor plan GHTN: Continue procardia 30mg  XL po daily, IV labetalol for severe range Bps,  Continuous monitoring Rest Ambulate Frequent position changes to facilitate fetal rotation and descent. 10/15 CNM will reassess with cervical exam at 0800 or earlier if necessary Continue Cytotec Q4H PO per protocol due to unable to tolerate vaginal exams.  Anticipate, AROM and pitocin.  Anticipate labor progression and vaginal delivery.  Baby Female: In pt circ desired after birth.   Report to be given to DR Maureen Ralphs Na d CNM at 0700 and they will assume care of pt.   Sallye Ober, NP-C, CNM, MSN 06/09/2020. 4:38 AM

## 2020-06-08 NOTE — MAU Note (Signed)
1215 -  Midwife aware of severe range blood pressures. Advised that patient may be nervous, declined giving medication at this time.  Midwife informed of continued severe range BP. Ordered medication at 1232.  Medication given.  BP after medication 135/91, 149/108. Midwife informed. Pt requested meal. Diet ordered per midwife.

## 2020-06-08 NOTE — MAU Note (Signed)
.   Mia Porter is a 24 y.o. at 107w0d here in MAU reporting: she had a large gush of fluid around 1030. Denies any vaginal bleeding or contractions. +FM  Onset of complaint: 1030 Pain score: 0 Vitals:   06/08/20 1129 06/08/20 1130  BP:  (!) 151/103  Pulse:  98  Resp:  18  Temp:  98.5 F (36.9 C)  SpO2: 100% 98%     FHT:135 Lab orders placed from triage:UA

## 2020-06-08 NOTE — H&P (Signed)
Mia Porter is a 24 y.o. female, G1P0000, IUP at 37.1 weeks, presenting for IOL for GHTN uncontrolled with medication procardia 30XL mg daily, with severe range BP 160/110s in MAU that required x1 dose IV labetalol, BP repeat was 135/91. Pt denies taking her BP medication today, she endorses taking it at night times.  Pt denies HA, RUQ pain or vision changes. Pt endorse + Fm. Denies vaginal leakage. Denies vaginal bleeding. Denies feeling cxt's. Interpretor used.   Prenatal Problem: Aemia (Hgb 9.6 at 27 weeks, rx'd Fe) Fetal growth restriction (EFW 7%ile at 21 weeks, primarily due to shortenend long bones, normal Panorama. F/U growth at 24 weeks 4.9%ile, started weekly dopplers 6/15, follow growth. RECHECK GROWTH 7/27 21%ILE, RECHECK IN 3 WEEKS AT MFM--EFW 13%ILE. RECHECK IN 3 WEEKS,  EFW 13% on 8/17, BPP 8/8, and Normal S/D ration on dopplers, EFW 5.3lbs 9% on 9/9)  Language difficulty (Husband as translator, speaks [place] ([place]), understands some English.) Pregnancy-induced hypertension (Dx 9/9, on Procardia 30mg  XL, scheduled for induction 06/09/20, labs negative on 9/9, PCR undetected.)  Patient Active Problem List   Diagnosis Date Noted  . Gestational hypertension 06/02/2020     Medications Prior to Admission  Medication Sig Dispense Refill Last Dose  . NIFEdipine (ADALAT CC) 30 MG 24 hr tablet Take 1 tablet (30 mg total) by mouth daily. 30 tablet 1   . ferrous sulfate 325 (65 FE) MG EC tablet Take 325 mg by mouth 3 (three) times daily with meals.     . Prenatal Vit w/Fe-Methylfol-FA (PNV PO) Take by mouth.       Past Medical History:  Diagnosis Date  . Medical history non-contributory   . Pregnancy induced hypertension      No current facility-administered medications on file prior to encounter.   Current Outpatient Medications on File Prior to Encounter  Medication Sig Dispense Refill  . ferrous sulfate 325 (65 FE) MG EC tablet Take 325 mg by mouth 3 (three) times daily  with meals.    Marland Kitchen NIFEdipine (ADALAT CC) 30 MG 24 hr tablet Take 1 tablet (30 mg total) by mouth daily. 30 tablet 1  . Prenatal Vit w/Fe-Methylfol-FA (PNV PO) Take by mouth.       No Known Allergies  History of present pregnancy: Pt Info/Preference:  Screening/Consents:  Labs:   EDD: Estimated Date of Delivery: 06/29/20  Establised: Patient's last menstrual period was 09/23/2019 (exact date).  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 VBAC: no RPR:   NR  Circumcision: In pt desired   HBsAg:  Neg  Pediatrician:  ???   HIV:   NR  Prenatal Classes: no Additional [place]:  EFW 13% on 8/17, BPP 8/8, and Normal S/D ration on dopplers GBS:  Pending sent 9/13(For PCN allergy, check sensitivities)       Chlamydia: neg    MFM Referral/Consult:  GC: neg  Support Person: husband   PAP: 2021-normal  Pain Management: epidural Neonatologist Referral:  Hgb Electrophoresis:  AA  Birth Plan: DCC   Hgb NOB: 11.3  28W: 9.6   OB History    Gravida  1   Para      Term      Preterm      AB      Living  0     SAB      TAB      Ectopic      Multiple      Live Births             Past Medical History:  Diagnosis Date  . Medical history non-contributory   . Pregnancy induced hypertension    Past Surgical History:  Procedure Laterality Date  . NO PAST SURGERIES     Family History: family history is not on file. Social History:  reports that she has never smoked. She has never used smokeless tobacco. She reports previous alcohol use. She reports previous drug use.   Prenatal Transfer Tool  Maternal Diabetes:  No Genetic Screening: Normal Maternal Ultrasounds/Referrals: Other:Short long bones Fetal Ultrasounds or other Referrals:  Referred to Materal Fetal Medicine  Maternal Substance Abuse:  No Significant Maternal Medications:  Meds include: Other: Procardia 30 XL mg daily Significant Maternal Lab Results: Other: GBS pending, sent 9/13   ROS:  Review of Systems  Constitutional: Negative.   HENT: Negative.   Eyes: Negative.   Respiratory: Negative.   Cardiovascular: Negative.   Gastrointestinal: Negative.   Genitourinary: Negative.   Musculoskeletal: Negative.   Skin: Negative.   Neurological: Negative.   Endo/Heme/Allergies: Negative.   Psychiatric/Behavioral: Negative.      Physical Exam: LMP 09/23/2019 (Exact Date)   Physical Exam Vitals and nursing note reviewed. Exam conducted with a chaperone present.  Constitutional:      Appearance: Normal appearance.  HENT:     Head: Normocephalic and atraumatic.     Nose: Nose normal.     Mouth/Throat:     Mouth: Mucous membranes are moist.  Eyes:     Extraocular Movements: Extraocular movements intact.     Conjunctiva/sclera: Conjunctivae normal.     Pupils: Pupils are equal, round, and reactive to light.  Cardiovascular:     Rate and Rhythm: Normal rate and regular rhythm.     Pulses: Normal pulses.     Heart sounds: Normal heart sounds.  Pulmonary:     Effort: Pulmonary effort is normal.     Breath sounds: Normal breath sounds.  Abdominal:     General: Bowel sounds are normal.  Genitourinary:    Comments: Gravida uterus , pelvis adequate for vaginal delivery.  Musculoskeletal:        General: Normal range of motion.     Cervical back: Normal range of motion and neck supple.  Skin:    General: Skin is warm.     Capillary Refill: Capillary refill takes less than 2 seconds.  Neurological:     General: No focal deficit present.     Mental Status: She is alert and oriented to person, place, and time.  Psychiatric:         Mood and Affect: Mood normal.      NST: FHR baseline 130 bpm, Variability: moderate, Accelerations:present, Decelerations:  Absent= Cat 1/Reactive UC:   none SVE:  0/30/-3  , vertex verified by fetal sutures. Pt did not tolerate vaginal exam  Leopold's: Position unable to obtain due to unable to tolerate vaginal exam, EFW 5.5lbs via leopold's.  Bedside US verified vertex.  Labs: Results for orders placed or performed during Mia Porter encounter of 06/08/20 (  from Mia past 24 hour(s))  CBC     Status: Abnormal   Collection Time: 06/08/20 12:22 PM  Result Value Ref Range   WBC 7.0 4.0 - 10.5 K/uL   RBC 4.33 3.87 - 5.11 MIL/uL   Hemoglobin 11.4 (L) 12.0 - 15.0 g/dL   HCT 16.1 36 - 46 %   MCV 83.8 80.0 - 100.0 fL   MCH 26.3 26.0 - 34.0 pg   MCHC 31.4 30.0 - 36.0 g/dL   RDW 09.6 (H) 04.5 - 40.9 %   Platelets 202 150 - 400 K/uL   nRBC 0.0 0.0 - 0.2 %  Comprehensive metabolic panel     Status: Abnormal   Collection Time: 06/08/20 12:22 PM  Result Value Ref Range   Sodium 136 135 - 145 mmol/L   Potassium 4.3 3.5 - 5.1 mmol/L   Chloride 105 98 - 111 mmol/L   CO2 17 (L) 22 - 32 mmol/L   Glucose, Bld 71 70 - 99 mg/dL   BUN 7 6 - 20 mg/dL   Creatinine, Ser 8.11 0.44 - 1.00 mg/dL   Calcium 9.9 8.9 - 91.4 mg/dL   Total Protein 6.3 (L) 6.5 - 8.1 g/dL   Albumin 2.8 (L) 3.5 - 5.0 g/dL   AST 22 15 - 41 U/L   ALT 20 0 - 44 U/L   Alkaline Phosphatase 124 38 - 126 U/L   Total Bilirubin 0.4 0.3 - 1.2 mg/dL   GFR calc non Af Amer >60 >60 mL/min   GFR calc Af Amer >60 >60 mL/min   Anion gap 14 5 - 15    Imaging:  [place] MFM FETAL BPP WO NON STRESS  Result Date: 06/02/2020 ----------------------------------------------------------------------  OBSTETRICS REPORT                       (Signed Final 06/02/2020 09:23 am) ---------------------------------------------------------------------- Patient Info  ID #:       [id number]                          D.O.B.:  1995/12/26 (24 yrs)  Name:       Mia [facility]  [facility]                Visit Date: 06/02/2020 07:57 am ---------------------------------------------------------------------- Performed By  Attending:        Noralee Space MD        Ref. Address:     [facility]                                                             [facility]  [address]                                                             [address]                                                             [place], [place]                                                             [address]  Performed By:     Percell Boston          Location[facility]  Referred By:      Osborn Coho MD ---------------------------------------------------------------------- Orders  #  Description                           Code        Ordered By  1  [place] MFM OB FOLLOW UP                   76816.01    YU FANG  2  [place] MFM FETAL BPP WO NON               60454.09    YU FANG     STRESS  3  [place] MFM UA CORD DOPPLER                [id number]    YU FANG ----------------------------------------------------------------------  #  Order #                     Accession #                Episode #  1  [id number]                   [id number]                 [id number]  2  [id number]                   [id number]                 [id number]  3  [id number]                   [id number]                 [id number] ---------------------------------------------------------------------- Indications  Maternal care for known or suspected poor      O36.5930  fetal growth, third trimester, not applicable or   unspecified IUGR  [redacted] weeks gestation of pregnancy                Z3A.36  Encounter for other antenatal screening        Z36.2  follow-up  Low Risk NIPS(Negative Horizon) ---------------------------------------------------------------------- Fetal Evaluation  Num Of Fetuses:         1  Cardiac Activity:       Observed  Presentation:           Cephalic  Placenta:               Anterior w/ post left succ lobe  P. Cord Insertion:      Previously Visualized  Amniotic Fluid  AFI FV:      Within normal limits  AFI Sum(cm)     %Tile       Largest Pocket(cm)  12.29           39          3.72  RUQ(cm)       RLQ(cm)       LUQ(cm)        LLQ(cm)  3.15          2.11          3.72           3.31 ---------------------------------------------------------------------- Biophysical Evaluation  Amniotic F.V:   Within normal limits       F. Tone:        Observed  F. Movement:    Observed                   Score:          8/8  F. Breathing:   Observed ---------------------------------------------------------------------- Biometry  BPD:      84.2  mm     G. Age:  33w 6d          7  %    CI:        75.88   %    70 - 86  FL/HC:      21.3   %    20.1 - 22.1  HC:      306.4  mm     G. Age:  34w 1d        1.3  %    HC/AC:      1.00        0.93 - 1.11  AC:      305.2  mm     G. Age:  34w 3d         16  %    FL/BPD:     77.7   %    71 - 87  FL:       65.4  mm     G. Age:  33w 5d        3.5  %    FL/AC:      21.4   %    20 - 24  Est. FW:    2366  gm      5 lb 3 oz      9  % ---------------------------------------------------------------------- OB History  Gravidity:    1         Term:   0        Prem:   0        SAB:   0  TOP:          0       Ectopic:  0        Living: 0 ---------------------------------------------------------------------- Gestational Age  LMP:           36w 1d        Date:  09/23/19                 EDD:   06/29/20  U/S Today:     34w 0d                                         EDD:   07/14/20  Best:          36w 1d     Det. By:  LMP  (09/23/19)          EDD:   06/29/20 ---------------------------------------------------------------------- Anatomy  Cranium:               Appears normal         Aortic Arch:            Previously seen  Cavum:                 Previously seen        Ductal Arch:            Previously seen  Ventricles:            Appears normal         Diaphragm:              Appears normal  Choroid Plexus:        Previously seen        Stomach:                Appears normal, left  sided  Cerebellum:            Previously seen        Abdomen:                Previously seen  Posterior Fossa:       Previously seen        Abdominal Wall:         Previously seen  Nuchal Fold:           Not applicable (>20    Cord Vessels:           Previously seen                         wks GA)  Face:                  Orbits and profile     Kidneys:                Appear normal                         previously seen  Lips:                  Previously seen        Bladder:                Appears normal  Thoracic:              Appears normal         Spine:                  Appears normal  Heart:                 Previously seen        Upper Extremities:      Previously seen  RVOT:                  Previously seen        Lower Extremities:      Previously seen  LVOT:                  Previously seen  Other:  Heels and 5th digit visualized previously. Technically difficult due to          fetal position. ---------------------------------------------------------------------- Doppler - Fetal Vessels  Umbilical Artery   S/D     %tile      RI    %tile      PI    %tile     PSV    ADFV    RDFV                                                     (cm/s)   3.16       88    0.68       90    1.21       97    33.87       N       N ---------------------------------------------------------------------- Cervix Uterus Adnexa  Cervix  Not  visualized (advanced GA >24wks)  Uterus  No abnormality visualized.  Right Ovary  No adnexal mass visualized.  Left Ovary  No adnexal mass visualized.  Cul De Sac  No free fluid seen.  Adnexa  No abnormality visualized. ---------------------------------------------------------------------- Impression  Patient returned for fetal growth assessment.  Fetal growth  restriction was seen on previous scans.  On today's ultrasound, amniotic fluid is normal and good fetal  activity seen.  Mia estimated fetal weight is at Mia 9th  percentile.  Abdominal circumference is at Mia 16th  percentile.Antenatal testing is reassuring. BPP 8/8.  Umbilical  artery Doppler showed normal forward diastolic flow.  Blood  pressures today at our office is 144/103 and 137/96 mmHg.  Patient does not have symptoms of severe features of  preeclampsia.  I explained Mia significance of increased blood pressure  readings and Mia need to rule out gestational  hypertension/preeclampsia.  Discussed with Nigel Bridgeman,  CNM.  We recommended Mia patient to go to [facility]U for  further evaluation. ---------------------------------------------------------------------- Recommendations  -BPP and UA Doppler next week if undelivered. ----------------------------------------------------------------------                  Noralee Space, MD Electronically Signed Final Report   06/02/2020 09:23 am ----------------------------------------------------------------------  [place] MFM OB FOLLOW UP  Result Date: 06/02/2020 ----------------------------------------------------------------------  OBSTETRICS REPORT                       (Signed Final 06/02/2020 09:23 am) ---------------------------------------------------------------------- Patient Info  ID #:       [id number]                          D.O.B.:  08-28-1996 (24 yrs)  Name:       Mia Porter                Visit Date: 06/02/2020 07:57 am ----------------------------------------------------------------------  Performed By  Attending:        Noralee Space MD        Ref. Address:     [facility]                                                             [facility]                                                             [address]  [address]                                                             [place], [place]                                                             [address]  Performed By:     Percell Boston          Location[facility]         [facility]                                     [facility]  Referred By:      Osborn Coho MD ---------------------------------------------------------------------- Orders  #  Description                           Code        Ordered By  1  [place] MFM OB FOLLOW UP                   76816.01    YU FANG  2  [place] MFM FETAL BPP WO NON               76819.01    YU FANG     STRESS  3  [place] MFM UA CORD DOPPLER                19147.82    YU FANG ----------------------------------------------------------------------  #  Order #                     Accession #                Episode #  1  [id number]                   [id number]                 [id number]  2  [id number]  [id number]                 [id number]  3  [id number]                   [id number]                 [id number] ---------------------------------------------------------------------- Indications  Maternal care for known or suspected poor      O36.5930  fetal growth, third trimester, not applicable or  unspecified IUGR  [redacted] weeks gestation of pregnancy                Z3A.36  Encounter for other antenatal screening        Z36.2  follow-up   Low Risk NIPS(Negative Horizon) ---------------------------------------------------------------------- Fetal Evaluation  Num Of Fetuses:         1  Cardiac Activity:       Observed  Presentation:           Cephalic  Placenta:               Anterior w/ post left succ lobe  P. Cord Insertion:      Previously Visualized  Amniotic Fluid  AFI FV:      Within normal limits  AFI Sum(cm)     %Tile       Largest Pocket(cm)  12.29           39          3.72  RUQ(cm)       RLQ(cm)       LUQ(cm)        LLQ(cm)  3.15          2.11          3.72           3.31 ---------------------------------------------------------------------- Biophysical Evaluation  Amniotic F.V:   Within normal limits       F. Tone:        Observed  F. Movement:    Observed                   Score:          8/8  F. Breathing:   Observed ---------------------------------------------------------------------- Biometry  BPD:      84.2  mm     G. Age:  33w 6d          7  %    CI:        75.88   %    70 - 86                                                          FL/HC:      21.3   %    20.1 - 22.1  HC:      306.4  mm     G. Age:  34w 1d        1.3  %    HC/AC:      1.00        0.93 - 1.11  AC:      305.2  mm     G. Age:  34w 3d         16  %    FL/BPD:     77.7   %  71 - 87  FL:       65.4  mm     G. Age:  33w 5d        3.5  %    FL/AC:      21.4   %    20 - 24  Est. FW:    2366  gm      5 lb 3 oz      9  % ---------------------------------------------------------------------- OB History  Gravidity:    1         Term:   0        Prem:   0        SAB:   0  TOP:          0       Ectopic:  0        Living: 0 ---------------------------------------------------------------------- Gestational Age  LMP:           36w 1d        Date:  09/23/19                 EDD:   06/29/20  U/S Today:     34w 0d                                        EDD:   07/14/20  Best:          36w 1d     Det. By:  LMP  (09/23/19)          EDD:   06/29/20  ---------------------------------------------------------------------- Anatomy  Cranium:               Appears normal         Aortic Arch:            Previously seen  Cavum:                 Previously seen        Ductal Arch:            Previously seen  Ventricles:            Appears normal         Diaphragm:              Appears normal  Choroid Plexus:        Previously seen        Stomach:                Appears normal, left                                                                        sided  Cerebellum:            Previously seen        Abdomen:                Previously seen  Posterior Fossa:       Previously seen        Abdominal Wall:         Previously seen  Nuchal Fold:  Not applicable (>20    Cord Vessels:           Previously seen                         wks GA)  Face:                  Orbits and profile     Kidneys:                Appear normal                         previously seen  Lips:                  Previously seen        Bladder:                Appears normal  Thoracic:              Appears normal         Spine:                  Appears normal  Heart:                 Previously seen        Upper Extremities:      Previously seen  RVOT:                  Previously seen        Lower Extremities:      Previously seen  LVOT:                  Previously seen  Other:  Heels and 5th digit visualized previously. Technically difficult due to          fetal position. ---------------------------------------------------------------------- Doppler - Fetal Vessels  Umbilical Artery   S/D     %tile      RI    %tile      PI    %tile     PSV    ADFV    RDFV                                                     (cm/s)   3.16       88    0.68       90    1.21       97    33.87       N       N ---------------------------------------------------------------------- Cervix Uterus Adnexa  Cervix  Not visualized (advanced GA >24wks)  Uterus  No abnormality visualized.  Right Ovary  No adnexal mass  visualized.  Left Ovary  No adnexal mass visualized.  Cul De Sac  No free fluid seen.  Adnexa  No abnormality visualized. ---------------------------------------------------------------------- Impression  Patient returned for fetal growth assessment.  Fetal growth  restriction was seen on previous scans.  On today's ultrasound, amniotic fluid is normal and good fetal  activity seen.  Mia estimated fetal weight is at Mia 9th  percentile.  Abdominal circumference is at Mia 16th  percentile.Antenatal testing is reassuring. BPP 8/8.  Umbilical  artery Doppler showed normal forward diastolic flow.  Blood  pressures today  at our office is 144/103 and 137/96 mmHg.  Patient does not have symptoms of severe features of  preeclampsia.  I explained Mia significance of increased blood pressure  readings and Mia need to rule out gestational  hypertension/preeclampsia.  Discussed with Nigel Bridgeman,  CNM.  We recommended Mia patient to go to [facility] for  further evaluation. ---------------------------------------------------------------------- Recommendations  -BPP and UA Doppler next week if undelivered. ----------------------------------------------------------------------                  Noralee Space, MD Electronically Signed Final Report   06/02/2020 09:23 am ----------------------------------------------------------------------  [place] MFM OB FOLLOW UP  Result Date: 05/10/2020 ----------------------------------------------------------------------  OBSTETRICS REPORT                       (Signed Final 05/10/2020 10:58 am) ---------------------------------------------------------------------- Patient Info  ID #:       [id number]                          D.O.B.:  [birth year] (24 yrs)  Name:       Mia Porter                Visit Date: 05/10/2020 07:49 am ---------------------------------------------------------------------- Performed By  Attending:        Ma Rings MD         Ref. Address:     [facility]                                                             [facility]                                                             [address]                                                             [address]  [place], [place]                                                             [address]  Performed By:     Eden Lathe BS      Location:         [facility]                                 [facility]  Referred By:      Osborn Coho MD ---------------------------------------------------------------------- Orders  #  Description                           Code        Ordered By  1  [place] MFM OB FOLLOW UP                   60454.09    Noralee Space ----------------------------------------------------------------------  #  Order #                     Accession #                Episode #  1  [id number]                   [id number]                 [id number] ---------------------------------------------------------------------- Indications  Small for gestational age fetus affecting      O36.5990  management of mother  101 weeks gestation of pregnancy                Z3A.32  Encounter for other antenatal screening        Z36.2  follow-up ---------------------------------------------------------------------- Fetal Evaluation  Num Of Fetuses:         1  Fetal Heart Rate(bpm):  132  Cardiac Activity:       Observed  Presentation:           Breech  Placenta:               Ant w/ post left succenturiate lobe  Amniotic Fluid  AFI FV:      Within normal limits  AFI Sum(cm)     %  Tile       Largest Pocket(cm)  15.6            56          5.9  RUQ(cm)        RLQ(cm)       LUQ(cm)        LLQ(cm)  2.6           3.2           5.9            3.9 ---------------------------------------------------------------------- Biometry  BPD:      81.5  mm     G. Age:  32w 5d         40  %    CI:        74.98   %    70 - 86                                                          FL/HC:      18.9   %    19.9 - 21.5  HC:      298.6  mm     G. Age:  33w 1d         19  %    HC/AC:      1.05        0.96 - 1.11  AC:      285.6  mm     G. Age:  32w 4d         42  %    FL/BPD:     69.2   %    71 - 87  FL:       56.4  mm     G. Age:  29w 4d        < 1  %    FL/AC:      19.7   %    20 - 24  HUM:      51.7  mm     G. Age:  30w 1d        < 5  %  Est. FW:    1833  gm      4 lb 1 oz     13  % ---------------------------------------------------------------------- OB History  Gravidity:    1         Term:   0        Prem:   0        SAB:   0  TOP:          0       Ectopic:  0        Living: 0 ---------------------------------------------------------------------- Gestational Age  LMP:           32w 6d        Date:  09/23/19                 EDD:   06/29/20  U/S Today:     32w 0d                                        EDD:   07/05/20  Best:  32w 6d     Det. By:  LMP  (09/23/19)          EDD:   06/29/20 ---------------------------------------------------------------------- Anatomy  Cranium:               Appears normal         Aortic Arch:            Appears normal  Cavum:                 Appears normal         Ductal Arch:            Appears normal  Ventricles:            Appears normal         Diaphragm:              Appears normal  Choroid Plexus:        Previously seen        Stomach:                Appears normal, left                                                                        sided  Cerebellum:            Appears normal         Abdomen:                Previously seen  Posterior Fossa:       Previously seen        Abdominal Wall:         Previously seen  Nuchal Fold:           Not  applicable (>20    Cord Vessels:           Appears normal ([redacted]                         wks GA)                                        vessel cord)  Face:                  Orbits and profile     Kidneys:                Appear normal                         previously seen  Lips:                  Previously seen        Bladder:                Appears normal  Thoracic:              Appears normal         Spine:                  Appears normal  Heart:  Previously seen        Upper Extremities:      Previously seen  RVOT:                  Previously seen        Lower Extremities:      Previously seen  LVOT:                  Previously seen  Other:  Heels and 5th digit visualized previously. Technically difficult due to          fetal position. ---------------------------------------------------------------------- Doppler - Fetal Vessels  Umbilical Artery   S/D     %tile      RI    %tile      PI    %tile     PSV    ADFV    RDFV                                                     (cm/s)   2.44       39    0.59       44    0.89       51    31.44      No      No ---------------------------------------------------------------------- Cervix Uterus Adnexa  Cervix  Not visualized (advanced GA >24wks)  Uterus  No abnormality visualized.  Right Ovary  Within normal limits.  Left Ovary  Within normal limits.  Cul De Sac  No free fluid seen.  Adnexa  No abnormality visualized. ---------------------------------------------------------------------- Comments  This patient was seen for a follow up growth scan due to a  small for gestational age fetus that was noted during her prior  ultrasound exams.  She denies any problems since her last  exam and reports feeling vigorous fetal movements  throughout Mia day.  She was informed that Mia fetal growth and amniotic fluid  level appears appropriate for her gestational age.  Mia overall  EFW obtained today is in Mia lower normal range (13th  percentile).  Doppler studies of Mia  umbilical arteries showed a normal  S/D ratio.  There were no signs of absent or reversed end-  diastolic flow noted today.  A follow-up exam was scheduled in 3 weeks to assess Mia  fetal growth. ----------------------------------------------------------------------                   Ma Rings, MD Electronically Signed Final Report   05/10/2020 10:58 am ----------------------------------------------------------------------  [place] MFM UA CORD DOPPLER  Result Date: 06/02/2020 ----------------------------------------------------------------------  OBSTETRICS REPORT                       (Signed Final 06/02/2020 09:23 am) ---------------------------------------------------------------------- Patient Info  ID #:       [id number]                          D.O.B.:  1995/12/27 (24 yrs)  Name:       Mia Porter                Visit Date: 06/02/2020 07:57 am ---------------------------------------------------------------------- Performed By  Attending:        Noralee Space MD        Ref. Address:  [facility]                                                             [facility]                                                             [address]                                                             [address]                                                             [place], [place]                                                             [address]  Performed By:     Percell Boston          [facility]                                                             [facility]  Referred By:      Osborn Coho MD ---------------------------------------------------------------------- Orders   #  Description                           Code        Ordered By  1  [place] MFM OB FOLLOW UP                   76816.01    YU FANG  2  [place] MFM FETAL BPP WO NON               76819.01    YU FANG     STRESS  3  [place] MFM UA CORD DOPPLER                76820.02    YU FANG ----------------------------------------------------------------------  #  Order #                     Accession #                Episode #  1  [id number]                   [id number]                 [id number]  2  [id number]                   [id number]                 [id number]  3  [id number]                   [id number]                 [id number] ---------------------------------------------------------------------- Indications  Maternal care for known or suspected poor      O36.5930  fetal growth, third trimester, not applicable or  unspecified IUGR  [redacted] weeks gestation of pregnancy                Z3A.36  Encounter for other antenatal screening        Z36.2  follow-up  Low Risk NIPS(Negative Horizon) ---------------------------------------------------------------------- Fetal Evaluation  Num Of Fetuses:         1  Cardiac Activity:       Observed  Presentation:           Cephalic  Placenta:               Anterior w/ post left succ lobe  P. Cord Insertion:      Previously Visualized  Amniotic Fluid  AFI FV:      Within normal limits  AFI Sum(cm)     %Tile       Largest Pocket(cm)  12.29           39          3.72  RUQ(cm)       RLQ(cm)       LUQ(cm)        LLQ(cm)  3.15          2.11          3.72           3.31 ---------------------------------------------------------------------- Biophysical Evaluation  Amniotic F.V:   Within normal limits  F. Tone:        Observed  F. Movement:    Observed                   Score:          8/8  F. Breathing:   Observed ---------------------------------------------------------------------- Biometry  BPD:      84.2  mm     G. Age:  33w 6d          7  %    CI:        75.88   %    70 - 86                                                           FL/HC:      21.3   %    20.1 - 22.1  HC:      306.4  mm     G. Age:  34w 1d        1.3  %    HC/AC:      1.00        0.93 - 1.11  AC:      305.2  mm     G. Age:  34w 3d         16  %    FL/BPD:     77.7   %    71 - 87  FL:       65.4  mm     G. Age:  33w 5d        3.5  %    FL/AC:      21.4   %    20 - 24  Est. FW:    2366  gm      5 lb 3 oz      9  % ---------------------------------------------------------------------- OB History  Gravidity:    1         Term:   0        Prem:   0        SAB:   0  TOP:          0       Ectopic:  0        Living: 0 ---------------------------------------------------------------------- Gestational Age  LMP:           36w 1d        Date:  09/23/19                 EDD:   06/29/20  U/S Today:     34w 0d                                        EDD:   07/14/20  Best:          36w 1d     Det. By:  LMP  (09/23/19)          EDD:   06/29/20 ---------------------------------------------------------------------- Anatomy  Cranium:               Appears normal         Aortic Arch:  Previously seen  Cavum:                 Previously seen        Ductal Arch:            Previously seen  Ventricles:            Appears normal         Diaphragm:              Appears normal  Choroid Plexus:        Previously seen        Stomach:                Appears normal, left                                                                        sided  Cerebellum:            Previously seen        Abdomen:                Previously seen  Posterior Fossa:       Previously seen        Abdominal Wall:         Previously seen  Nuchal Fold:           Not applicable (>20    Cord Vessels:           Previously seen                         wks GA)  Face:                  Orbits and profile     Kidneys:                Appear normal                         previously seen  Lips:                  Previously seen        Bladder:                Appears normal  Thoracic:              Appears normal          Spine:                  Appears normal  Heart:                 Previously seen        Upper Extremities:      Previously seen  RVOT:                  Previously seen        Lower Extremities:      Previously seen  LVOT:                  Previously seen  Other:  Heels and 5th digit visualized previously. Technically difficult due to  fetal position. ---------------------------------------------------------------------- Doppler - Fetal Vessels  Umbilical Artery   S/D     %tile      RI    %tile      PI    %tile     PSV    ADFV    RDFV                                                     (cm/s)   3.16       88    0.68       90    1.21       97    33.87       N       N ---------------------------------------------------------------------- Cervix Uterus Adnexa  Cervix  Not visualized (advanced GA >24wks)  Uterus  No abnormality visualized.  Right Ovary  No adnexal mass visualized.  Left Ovary  No adnexal mass visualized.  Cul De Sac  No free fluid seen.  Adnexa  No abnormality visualized. ---------------------------------------------------------------------- Impression  Patient returned for fetal growth assessment.  Fetal growth  restriction was seen on previous scans.  On today's ultrasound, amniotic fluid is normal and good fetal  activity seen.  Mia estimated fetal weight is at Mia 9th  percentile.  Abdominal circumference is at Mia 16th  percentile.Antenatal testing is reassuring. BPP 8/8.  Umbilical  artery Doppler showed normal forward diastolic flow.  Blood  pressures today at our office is 144/103 and 137/96 mmHg.  Patient does not have symptoms of severe features of  preeclampsia.  I explained Mia significance of increased blood pressure  readings and Mia need to rule out gestational  hypertension/preeclampsia.  Discussed with Nigel Bridgeman,  CNM.  We recommended Mia patient to go to Mia MAU for  further evaluation. ---------------------------------------------------------------------- Recommendations   -BPP and UA Doppler next week if undelivered. ----------------------------------------------------------------------                  Noralee Space, MD Electronically Signed Final Report   06/02/2020 09:23 am ----------------------------------------------------------------------   MAU Course: No orders of Mia defined types were placed in this encounter.  No orders of Mia defined types were placed in this encounter.   Assessment/Plan: Krystyna Cleckley is a 24 y.o. female, G1P0000, IUP at 37.1 weeks, presenting for IOL for GHTN uncontrolled with medication procardia 30XL mg daily, with severe range BP 160/110s in MAU that required x1 dose IV labetalol, BP repeat was 135/91. Pt denies taking her BP medication today, she endorses taking it at night times.  Pt denies HA, RUQ pain or vision changes. Pt endorse + Fm. Denies vaginal leakage. Denies vaginal bleeding. Denies feeling cxt's. Interpretor used.    Prenatal Problem: Aemia (Hgb 9.6 at 27 weeks, rx'd Fe) Fetal growth restriction (EFW 7%ile at 21 weeks, primarily due to shortenend long bones, normal Panorama. F/U growth at 24 weeks 4.9%ile, started weekly dopplers 6/15, follow growth. RECHECK GROWTH 7/27 21%ILE, RECHECK IN 3 WEEKS AT MFM--EFW 13%ILE. RECHECK IN 3 WEEKS,  EFW 13% on 8/17, BPP 8/8, and Normal S/D ration on dopplers, EFW 5.3lbs 9% on 9/9)  Language difficulty (Husband as translator, speaks Louie Casa ([place]), understands some English.) Pregnancy-induced hypertension (Dx 9/9, on Procardia  XL, scheduled for induction 06/09/20, labs negative on 9/9, PCR undetected, asymptomatic.)  FWB: Cat 1 Fetal Tracing.  I discussed with patient risks, benefits and alternatives of labor induction including higher risk of cesarean delivery compared to spontaneous labor.  We discussed risks of induction agents including effects on fetal heart beat, contraction pattern and need for close monitoring.  Patient expressed understanding of all this and  desired to proceed with Mia induction. Risks and benefits of induction were reviewed, including failure of method, prolonged labor, need for further intervention, risk of cesarean.  Patient and family verbalized understanding and denies any further questions at this time. Pt and family wish to proceed with induction process. Discussed induction options of Cytotec, foley bulb, AROM, and pitocin were reviewed as well as risks and benefits with use of each discussed.  Plan: Admit to Birthing Suite per consult with DR Su Hiltoberts Routine [facility] orders Start induction with Cytotec 50mcg PO for cervical ripening, due to unable to tolerate vaginal exams.  GHTN: Monitor BP, continue procardia 30mg  XL daily, Pending Labs.  Pain med/epidural prn GBS Pending: Should result 9/16. GBS PCR Sent and pending.  Baby Female: In pt circ desired.  Anticipate labor progression   Dale DurhamJade Laila Myhre NP-C, CNM, MSN 06/08/2020, 1:45 PM

## 2020-06-09 ENCOUNTER — Inpatient Hospital Stay (HOSPITAL_COMMUNITY): Payer: Medicaid Other | Admitting: Anesthesiology

## 2020-06-09 ENCOUNTER — Encounter (HOSPITAL_COMMUNITY): Payer: Self-pay | Admitting: Obstetrics and Gynecology

## 2020-06-09 ENCOUNTER — Inpatient Hospital Stay (HOSPITAL_COMMUNITY): Payer: Medicaid Other

## 2020-06-09 ENCOUNTER — Inpatient Hospital Stay (HOSPITAL_COMMUNITY)
Admission: AD | Admit: 2020-06-09 | Payer: Medicaid Other | Source: Home / Self Care | Admitting: Obstetrics and Gynecology

## 2020-06-09 LAB — CBC
HCT: 34 % — ABNORMAL LOW (ref 36.0–46.0)
HCT: 35.4 % — ABNORMAL LOW (ref 36.0–46.0)
Hemoglobin: 10.7 g/dL — ABNORMAL LOW (ref 12.0–15.0)
Hemoglobin: 11.1 g/dL — ABNORMAL LOW (ref 12.0–15.0)
MCH: 26.3 pg (ref 26.0–34.0)
MCH: 25.9 pg — ABNORMAL LOW (ref 26.0–34.0)
MCHC: 31.5 g/dL (ref 30.0–36.0)
MCHC: 31.4 g/dL (ref 30.0–36.0)
MCV: 83.5 fL (ref 80.0–100.0)
MCV: 82.7 fL (ref 80.0–100.0)
Platelets: 179 10*3/uL (ref 150–400)
Platelets: 174 10*3/uL (ref 150–400)
RBC: 4.07 MIL/uL (ref 3.87–5.11)
RBC: 4.28 MIL/uL (ref 3.87–5.11)
RDW: 18.6 % — ABNORMAL HIGH (ref 11.5–15.5)
RDW: 18.7 % — ABNORMAL HIGH (ref 11.5–15.5)
WBC: 7.5 10*3/uL (ref 4.0–10.5)
WBC: 10.1 10*3/uL (ref 4.0–10.5)
nRBC: 0 % (ref 0.0–0.2)
nRBC: 0 % (ref 0.0–0.2)

## 2020-06-09 LAB — RPR: RPR Ser Ql: NONREACTIVE

## 2020-06-09 MED ORDER — ACETAMINOPHEN 325 MG PO TABS
650.0000 mg | ORAL_TABLET | ORAL | Status: DC | PRN
  Administered 2020-06-10: 650 mg via ORAL
  Filled 2020-06-09: qty 2

## 2020-06-09 MED ORDER — ONDANSETRON HCL 4 MG/2ML IJ SOLN: 4.0000 mg | INTRAMUSCULAR | Status: DC | PRN

## 2020-06-09 MED ORDER — ZOLPIDEM TARTRATE 5 MG PO TABS: 5.0000 mg | ORAL_TABLET | Freq: Every evening | ORAL | Status: DC | PRN

## 2020-06-09 MED ORDER — DIPHENHYDRAMINE HCL 25 MG PO CAPS: 25.0000 mg | ORAL_CAPSULE | Freq: Four times a day (QID) | ORAL | Status: DC | PRN

## 2020-06-09 MED ORDER — FENTANYL-BUPIVACAINE-NACL 0.5-0.125-0.9 MG/250ML-% EP SOLN
12.0000 mL/h | EPIDURAL | Status: DC | PRN
  Filled 2020-06-09: qty 250

## 2020-06-09 MED ORDER — NIFEDIPINE ER OSMOTIC RELEASE 30 MG PO TB24
30.0000 mg | ORAL_TABLET | Freq: Every day | ORAL | Status: DC
  Administered 2020-06-10 – 2020-06-11 (×3): 30 mg via ORAL
  Filled 2020-06-09 (×3): qty 1

## 2020-06-09 MED ORDER — EPHEDRINE 5 MG/ML INJ: 10.0000 mg | INTRAVENOUS | Status: DC | PRN

## 2020-06-09 MED ORDER — COCONUT OIL OIL
1.0000 "application " | TOPICAL_OIL | Status: DC | PRN
  Administered 2020-06-11: 1 via TOPICAL

## 2020-06-09 MED ORDER — BENZOCAINE-MENTHOL 20-0.5 % EX AERO
1.0000 "application " | INHALATION_SPRAY | CUTANEOUS | Status: DC | PRN
  Administered 2020-06-10: 1 via TOPICAL
  Filled 2020-06-09: qty 56

## 2020-06-09 MED ORDER — PHENYLEPHRINE 40 MCG/ML (10ML) SYRINGE FOR IV PUSH (FOR BLOOD PRESSURE SUPPORT)
80.0000 ug | PREFILLED_SYRINGE | INTRAVENOUS | Status: DC | PRN
  Filled 2020-06-09: qty 10

## 2020-06-09 MED ORDER — SENNOSIDES-DOCUSATE SODIUM 8.6-50 MG PO TABS
2.0000 | ORAL_TABLET | ORAL | Status: DC
  Administered 2020-06-10 (×2): 2 via ORAL
  Filled 2020-06-09 (×2): qty 2

## 2020-06-09 MED ORDER — ONDANSETRON HCL 4 MG PO TABS: 4.0000 mg | ORAL_TABLET | ORAL | Status: DC | PRN

## 2020-06-09 MED ORDER — PRENATAL MULTIVITAMIN CH
1.0000 | ORAL_TABLET | Freq: Every day | ORAL | Status: DC
  Administered 2020-06-10 – 2020-06-11 (×2): 1 via ORAL
  Filled 2020-06-09 (×2): qty 1

## 2020-06-09 MED ORDER — WITCH HAZEL-GLYCERIN EX PADS: 1.0000 "application " | MEDICATED_PAD | CUTANEOUS | Status: DC | PRN

## 2020-06-09 MED ORDER — FERROUS SULFATE 325 (65 FE) MG PO TABS
325.0000 mg | ORAL_TABLET | Freq: Two times a day (BID) | ORAL | Status: DC
  Administered 2020-06-10 – 2020-06-11 (×4): 325 mg via ORAL
  Filled 2020-06-09 (×4): qty 1

## 2020-06-09 MED ORDER — SIMETHICONE 80 MG PO CHEW: 80.0000 mg | CHEWABLE_TABLET | ORAL | Status: DC | PRN

## 2020-06-09 MED ORDER — OXYTOCIN-SODIUM CHLORIDE 30-0.9 UT/500ML-% IV SOLN
1.0000 m[IU]/min | INTRAVENOUS | Status: DC
  Administered 2020-06-09: 2 m[IU]/min via INTRAVENOUS

## 2020-06-09 MED ORDER — LIDOCAINE HCL (PF) 1 % IJ SOLN
INTRAMUSCULAR | Status: DC | PRN
  Administered 2020-06-09 (×2): 4 mL via EPIDURAL

## 2020-06-09 MED ORDER — BUPIVACAINE HCL (PF) 0.75 % IJ SOLN
INTRAMUSCULAR | Status: DC | PRN
Start: 2020-06-09 — End: 2020-06-09
  Administered 2020-06-09: 12 mL/h via EPIDURAL

## 2020-06-09 MED ORDER — LACTATED RINGERS IV SOLN: 500.0000 mL | Freq: Once | INTRAVENOUS | Status: DC

## 2020-06-09 MED ORDER — IBUPROFEN 600 MG PO TABS
600.0000 mg | ORAL_TABLET | Freq: Four times a day (QID) | ORAL | Status: DC
  Administered 2020-06-10 – 2020-06-11 (×8): 600 mg via ORAL
  Filled 2020-06-09 (×8): qty 1

## 2020-06-09 MED ORDER — DIBUCAINE (PERIANAL) 1 % EX OINT: 1.0000 "application " | TOPICAL_OINTMENT | CUTANEOUS | Status: DC | PRN

## 2020-06-09 MED ORDER — PHENYLEPHRINE 40 MCG/ML (10ML) SYRINGE FOR IV PUSH (FOR BLOOD PRESSURE SUPPORT): 80.0000 ug | PREFILLED_SYRINGE | INTRAVENOUS | Status: DC | PRN

## 2020-06-09 MED ORDER — DIPHENHYDRAMINE HCL 50 MG/ML IJ SOLN: 12.5000 mg | INTRAMUSCULAR | Status: DC | PRN

## 2020-06-09 MED ORDER — TETANUS-DIPHTH-ACELL PERTUSSIS 5-2.5-18.5 LF-MCG/0.5 IM SUSP: 0.5000 mL | Freq: Once | INTRAMUSCULAR | Status: DC

## 2020-06-09 NOTE — Anesthesia Procedure Notes (Signed)
Epidural Patient location during procedure: OB Start time: 06/09/2020 3:35 PM End time: 06/09/2020 3:45 PM  Staffing Anesthesiologist: Mal Amabile, MD Performed: anesthesiologist   Preanesthetic Checklist Completed: patient identified, IV checked, site marked, risks and benefits discussed, surgical consent, monitors and equipment checked, pre-op evaluation and timeout performed  Epidural Patient position: sitting Prep: DuraPrep and site prepped and draped Patient monitoring: continuous pulse ox and blood pressure Approach: midline Location: L3-L4 Injection technique: LOR saline  Needle:  Needle type: Tuohy  Needle gauge: 17 G Needle length: 9 cm and 9 Needle insertion depth: 5 cm cm Catheter type: closed end flexible Catheter size: 19 Gauge Catheter at skin depth: 10 cm Test dose: negative and Other  Assessment Events: blood not aspirated, injection not painful, no injection resistance, no paresthesia and negative IV test  Additional Notes Patient identified. Risks and benefits discussed including failed block, incomplete  Pain control, post dural puncture headache, nerve damage, paralysis, blood pressure Changes, nausea, vomiting, reactions to medications-both toxic and allergic and post Partum back pain. All questions were answered. Patient expressed understanding and wished to proceed. Sterile technique was used throughout procedure. Epidural site was Dressed with sterile barrier dressing. No paresthesias, signs of intravascular injection Or signs of intrathecal spread were encountered.  Patient was more comfortable after the epidural was dosed. Please see RN's note for documentation of vital signs and FHR which are stable. Reason for block:procedure for pain

## 2020-06-09 NOTE — Progress Notes (Signed)
MD Addendum: I reviewed chart, reviewed fetal heart tracings and I agree with above findings, assessment and plan as outlined above by Roma Schanz MSN, CNM on 9/16 at 1645.  Dr. Sallye Ober. 06/09/2020. 1820.

## 2020-06-09 NOTE — Progress Notes (Signed)
Subjective:    Comfortable w/ epidural. Tolerates VE w/o diff.   Objective:    VS: BP 126/85   Pulse 84   Temp 97.9 F (36.6 C) (Axillary)   Resp 15   Wt 79.8 kg   LMP 09/23/2019 (Exact Date)   SpO2 97%   BMI 30.21 kg/m  FHR : baseline 130 / variability moderate / accelerations present / absent decelerations Toco: contractions every 2-4 minutes  Membranes: no palpable amniotic sac, presumed SROM since 0827 Dilation: 3 Effacement (%): 80 Cervical Position: Posterior Station: -2 Presentation: Vertex Exam by:: [employer] RN Pitocin 8 mU/min  Assessment/Plan:   24 y.o. G1P0 103w1d IOL for GHTN    -severe range BP x2 w/ good response to Labetalol IV    -continue Procardia XL 30 mg PO daily Labor: oral Cytotec x3 doses, then started Pitocin @ 1200 Preeclampsia:  no signs or symptoms of toxicity, intake and ouput balanced and labs stable Fetal Wellbeing:  Category I Pain Control:  Epidural I/D:  GBS neg Anticipated MOD:  NSVD  Mia Schanz MSN, CNM 06/09/2020 4:45 PM

## 2020-06-09 NOTE — Progress Notes (Signed)
Subjective:    Improved tolerance of vaginal exam. Watery D/C noted on exam glove, no nitrazine paper available, pt is not certain of LOF and does not tolerate detailed exam to palpate amniotic sac. Will continue to assess for SROM.  Discussed plan to repeat Cytotec and then start Pitocin in 4 hours and pt agrees. Pain mngt plan reviewed and pt plans epidural when she is more uncomfortable.   Objective:    VS: BP (!) 163/100   Pulse 83   Temp 98.4 F (36.9 C) (Oral)   Resp 18   Wt 79.8 kg   LMP 09/23/2019 (Exact Date)   SpO2 99%   BMI 30.21 kg/m  FHR : baseline 130 / variability moderate / accelerations present / absent decelerations Toco: contractions are irreg Membranes: suspicious for SROM Dilation: 2 Effacement (%): 50 Station: -3 Presentation: Vertex Exam by:: Maureen Ralphs CNM   Assessment/Plan:   24 y.o. G1P0 [redacted]w[redacted]d IOL for GHTN    -severe range BP x2 w/ good response to Labetalol IV    -continue Procardia XL 30 mg PO daily  Labor: oral Cytotec x3, start Pitocin in 4 hrs Preeclampsia:  no signs or symptoms of toxicity and labs stable Fetal Wellbeing:  Category I Pain Control:  plans epidural I/D:  GBS neg Anticipated MOD:  NSVD  Roma Schanz MSN, CNM 06/09/2020 8:27 AM

## 2020-06-09 NOTE — Anesthesia Preprocedure Evaluation (Addendum)
Anesthesia Evaluation  Patient identified by MRN, date of birth, ID band Patient awake    Reviewed: Allergy & Precautions, H&P , Patient's Chart, lab work & pertinent test results  Airway Mallampati: II  TM Distance: >3 FB Neck ROM: full    Dental no notable dental hx. (+) Teeth Intact   Pulmonary neg pulmonary ROS,    Pulmonary exam normal breath sounds clear to auscultation       Cardiovascular hypertension, On Medications negative cardio ROS Normal cardiovascular exam Rhythm:regular Rate:Normal     Neuro/Psych negative neurological ROS  negative psych ROS   GI/Hepatic Neg liver ROS, GERD  ,  Endo/Other  Obesity  Renal/GU negative Renal ROS  negative genitourinary   Musculoskeletal   Abdominal   Peds  Hematology  (+) Blood dyscrasia, anemia ,   Anesthesia Other Findings   Reproductive/Obstetrics (+) Pregnancy                             Anesthesia Physical Anesthesia Plan  ASA: II  Anesthesia Plan: Epidural   Post-op Pain Management:    Induction:   PONV Risk Score and Plan:   Airway Management Planned:   Additional Equipment:   Intra-op Plan:   Post-operative Plan:   Informed Consent: I have reviewed the patients History and Physical, chart, labs and discussed the procedure including the risks, benefits and alternatives for the proposed anesthesia with the patient or authorized representative who has indicated his/her understanding and acceptance.       Plan Discussed with: Anesthesiologist  Anesthesia Plan Comments:         Anesthesia Quick Evaluation

## 2020-06-09 NOTE — Progress Notes (Signed)
Pt stable and waiting for a bed on LD, LD full.

## 2020-06-09 NOTE — Progress Notes (Signed)
Pt transferred to LD for IOL process, pt denies questions and is stable.

## 2020-06-10 LAB — CBC
HCT: 34 % — ABNORMAL LOW (ref 36.0–46.0)
Hemoglobin: 10.5 g/dL — ABNORMAL LOW (ref 12.0–15.0)
MCH: 25.9 pg — ABNORMAL LOW (ref 26.0–34.0)
MCHC: 30.9 g/dL (ref 30.0–36.0)
MCV: 83.7 fL (ref 80.0–100.0)
Platelets: 157 10*3/uL (ref 150–400)
RBC: 4.06 MIL/uL (ref 3.87–5.11)
RDW: 18.8 % — ABNORMAL HIGH (ref 11.5–15.5)
WBC: 7.8 10*3/uL (ref 4.0–10.5)
nRBC: 0 % (ref 0.0–0.2)

## 2020-06-10 MED ORDER — POLYETHYLENE GLYCOL 3350 17 G PO PACK
17.0000 g | PACK | Freq: Every day | ORAL | Status: DC
  Administered 2020-06-10 – 2020-06-11 (×2): 17 g via ORAL
  Filled 2020-06-10 (×2): qty 1

## 2020-06-10 MED ORDER — INFLUENZA VAC SPLIT QUAD 0.5 ML IM SUSY: 0.5000 mL | PREFILLED_SYRINGE | INTRAMUSCULAR | Status: DC

## 2020-06-10 NOTE — Progress Notes (Signed)
   06/10/20 1713 06/10/20 1718  Vital Signs  BP (!) 134/102 (2nd reading) (!) 135/109 (recheck; will make MD aware)  BP Location Right Arm Right Arm  Patient Position (if appropriate) Sitting Sitting  Pulse Rate 91 94   Notified Dorisann Frames, CNM regarding increased BP. She wants patient to get another dose of Procardia 30mg  now and will watch Bps through the chart over night.

## 2020-06-10 NOTE — Lactation Note (Addendum)
This note was copied from a baby's chart. Lactation Consultation Note   Infant is 21 hours old 37 weeks IUGR with 1% weight loss and birthweight under 6 lbs. Parents are interested in getting the baby to latch at the breast. Infant receiving formula currently with the extra slow flow nipple about 10-15 ml per feed. Mom had been set up on DEBP but only pumped 1x. Parents do not have a DEBP at home. LC talked to parents on how options online to get a breast pump.   LC used warm heat before nursing with massage and hand expression to stimulate the breasts.   LC noted infant had high palate but a good suck LC assisted infant to latch at the breast but could not get him to suck even with stimulation. Mom has compressible nipples and even with a teacup hold he would not latch but not suck. LC did not want to tire him out, so Dad gave infant Similac Expert Care 22 cal about 5 mls and stopped to burp him before offering more. Mom given breast shells to help elongate her nipples.   LC assisted Mom to get started with consistent pumping q 3 hours for at least 15 minutes. Flange size was increased from 24 to 27 for comfort.   Plan 1. Feed infant based on cues 8-12 x in 24 hour period not to go beyond 2-3 hours without a feed.          2. Mom can use heat just before nursing to help with milk let down.           3. Dad will then offer formula with 22 calorie Similac following the LPTI guidelines reviewed with parents. Infant tolerating 15 ml at this time.           4. Mom will then pump q 3 hours for 15 minutes.           5. LC will alert overnight staff that parents require assistance with latching at the next feed.

## 2020-06-10 NOTE — Anesthesia Postprocedure Evaluation (Signed)
Anesthesia Post Note  Patient: Velvie Shedden  Procedure(s) Performed: AN AD HOC LABOR EPIDURAL     Patient location during evaluation: Mother Baby Anesthesia Type: Epidural Level of consciousness: awake and alert Pain management: pain level controlled Vital Signs Assessment: post-procedure vital signs reviewed and stable Respiratory status: spontaneous breathing, nonlabored ventilation and respiratory function stable Cardiovascular status: stable Postop Assessment: no headache, no backache and epidural receding Anesthetic complications: no   No complications documented.  Last Vitals:  Vitals:   06/10/20 0030 06/10/20 0420  BP: (!) 141/97 (!) 142/93  Pulse: 95 91  Resp: 18 18  Temp: 37.2 C 36.8 C  SpO2: 100% 100%    Last Pain:  Vitals:   06/10/20 0420  TempSrc: Oral  PainSc: Asleep   Pain Goal:                   Junious Silk

## 2020-06-10 NOTE — Lactation Note (Addendum)
This note was copied from a baby's chart. Lactation Consultation Note Baby is 5 hrs old. IUGR 37 wks. New parents has lots of questions. [name] interpreter for mom. Mom very tired. Baby sleeping.  LPI information sheet given and reviewed w/[name]. Discussed importance of supplementing after BF every 2 1/2-3 hrs. But baby can go to breast more often as he wants. [name] states understanding.  Parents do want to give formula as well as BF. Stressed putting baby to the breast before just giving formula and give BM/colostrum before formula and not to mix together.  Newborn behavior, feeding habits, STS, strict I&O, breast massage during feeding, milk storage, importance of pumping, supply and demand discussed. Several time to make sure understands.  Hand expression taught. Mom's breast are tender. Dots of colostrum noted. Collected. [name] rubbed colostrum on baby's gums. Mom has everted nipples. Encouraged to pre-pump before latching w/hand pump for deeper latch.  Mom has generalized edema. Breast are very compressible at this time. Mom shown how to use DEBP & how to disassemble, clean, & reassemble parts. Mom knows to pump q3h for 15-20 min.  Mom encouraged to feed baby 8-12 times/24 hours and with feeding cues. Mom encouraged to waken baby for feedings if hasn't cued in 3 hrs.  Mom doesn't have DEBP at home. Doesn't have WIC. Mentioned may want to rent DEBP d/t low BW and supplementation.  Encouraged mom to rest while baby sleeping. Suggested [name] set clock to wake in 3 hrs from last feeding. Spoke w/RN before and after consult. Call for assistance for latching assistance. Parents appear to be needing lots of instruction and teaching. Lactation brochure given. Encouraged to call for assistance or questions.   Patient Name: Mia Porter [id number] Date: 06/10/2020 Reason for consult: Initial assessment;Early term 37-38.6wks;Infant < 6lbs;Primapara   Maternal Data Has patient been taught  Hand Expression?: Yes Does the patient have breastfeeding experience prior to this delivery?: No  Feeding Feeding Type: Breast Milk (drots of colostrum to gums) Nipple Type: Slow - flow  LATCH Score Latch: Too sleepy or reluctant, no latch achieved, no sucking elicited.  Audible Swallowing: None  Type of Nipple: Everted at rest and after stimulation  Comfort (Breast/Nipple): Filling, red/small blisters or bruises, mild/mod discomfort (breast slightly tender to touch)  Hold (Positioning): Full assist, staff holds infant at breast  LATCH Score: 3  Interventions Interventions: Breast feeding basics reviewed;Breast compression;Breast massage;Hand express;DEBP;Pre-pump if needed;Expressed milk  Lactation Tools Discussed/Used Tools: Pump Breast pump type: Double-Electric Breast Pump WIC Program: No Pump Review: Setup, frequency, and cleaning;Milk Storage Initiated by:: Peri Jefferson RN IBCLC Date initiated:: 06/10/20   Consult Status Consult Status: Follow-up Date: 06/10/20 Follow-up type: In-patient    Zionah Criswell, Diamond Nickel 06/10/2020, 2:18 AM

## 2020-06-10 NOTE — Progress Notes (Signed)
PPD # 1 S/P NSVD  Live born female  Birth Weight: 5 lb 3.1 oz (2356 g) APGAR: 8, 9  Newborn Delivery   Birth date/time: 06/09/2020 21:17:00 Delivery type: Vaginal, Spontaneous     Baby name: Vihaan Delivering provider: Rhea Pink B  Episiotomy:None   Lacerations:1st degree   Circumcision: Declines  Feeding: bottle  Pain control at delivery: Epidural   S:  Reports feeling well with minimal vaginal pain. Bleeding is moderate. Pt is waiting for milk to come in before attempting breastfeeding. Feeding baby formula. Denies headache, epigastric pain, or visual changes.             Tolerating po/ No nausea or vomiting             Bleeding is moderate             Pain controlled with acetaminophen and ibuprofen (OTC)             Up ad lib / ambulatory / voiding without difficulties   O:  A & O x 3, in no apparent distress              VS:  Vitals:   06/09/20 2325 06/10/20 0030 06/10/20 0420 06/10/20 0845  BP: (!) 148/104 (!) 141/97 (!) 142/93 (!) 126/93  Pulse: 98 95 91 85  Resp: 18 18 18 20   Temp: 98.7 F (37.1 C) 98.9 F (37.2 C) 98.2 F (36.8 C) 98.5 F (36.9 C)  TempSrc: Oral Oral Oral   SpO2: 100% 100% 100% 98%  Weight:        LABS:  Recent Labs    06/09/20 2309 06/10/20 0529  WBC 10.1 7.8  HGB 11.1* 10.5*  HCT 35.4* 34.0*  PLT 174 157    Blood type: --/--/B POS (09/15 1215)  Rubella:   Immune  I&O: I/O last 3 completed shifts: In: -  Out: 1700 [Urine:1600; Blood:100]          No intake/output data recorded.   Gen: AAO x 3, NAD  Abdomen: soft, non-tender, non-distended             Fundus: firm, non-tender, U-1  Perineum: repair intact, no edema  Lochia: moderate  Extremities: no edema, no calf pain or tenderness   A/P: PPD # 1 24 y.o., G1P1001   Principal Problem:   Postpartum care following vaginal delivery 9/16 Active Problems:   Gestational hypertension  Continue Procardia XL 30mg   Labs WNL  Denies symptoms of preeclampsia   SVD  (9/16)  Doing well - stable status  Routine post partum orders   First degree laceration of perineum during delivery, postpartum  Encouraged the use of peribottle when using the restroom and Dermaplast spray for pain  Recommended sitz baths after discharge home  Anticipate discharge tomorrow. Close follow-up in the office for hypertension.    , MSN, CNM 06/10/2020, 10:48 AM

## 2020-06-11 MED ORDER — IBUPROFEN 600 MG PO TABS: 600.0000 mg | ORAL_TABLET | Freq: Four times a day (QID) | ORAL | 0 refills | Status: AC

## 2020-06-11 NOTE — Discharge Summary (Signed)
SVD OB Discharge Summary     Patient Name: Mia Porter DOB: 02/13/1996 MRN: [id number]  Date of admission: 06/08/2020 Delivering MD: Rhea Pink B  Date of delivery: 06/09/2020 Type of delivery: SVD  Newborn Data: Sex: Baby  female  Circumcision: declined Live born female  Birth Weight: 5 lb 3.1 oz (2356 g) APGAR: 8, 9  Newborn Delivery   Birth date/time: [birth year]/09/2020 21:17:00 Delivery type: Vaginal, Spontaneous      Feeding: breast and bottle Infant being discharge to home with mother in stable condition.   Admitting diagnosis: Gestational hypertension [O13.9] Intrauterine pregnancy: [redacted]w[redacted]d     Secondary diagnosis:  Principal Problem:   Postpartum care following vaginal delivery 9/16 Active Problems:   Gestational hypertension   SVD (9/16)   First degree laceration of perineum during delivery, postpartum                                Complications: None                                                              Intrapartum Procedures: spontaneous vaginal delivery Postpartum Procedures: none Complications-Operative and Postpartum: 1st degree perineal laceration Augmentation: Pitocin and Cytotec   History of Present Illness: Mia Porter is a 24 y.o. female, G1P1001, who presents at [redacted]w[redacted]d weeks gestation. The patient has been followed at  [facility]  Her pregnancy has been complicated by:  Patient Active Problem List   Diagnosis Date Noted  . Postpartum care following vaginal delivery 9/16 06/10/2020  . First degree laceration of perineum during delivery, postpartum 06/10/2020  . SVD (9/16) 06/09/2020  . Gestational hypertension 06/02/2020    Hospital course:  Induction of Labor With Vaginal Delivery   24 y.o. yo G1P1001 at [redacted]w[redacted]d was admitted to the hospital 06/08/2020 for induction of labor.  Indication for induction: Gestational hypertension.  Patient had an uncomplicated labor course as follows: Membrane Rupture  Time/Date: 8:27 AM ,06/09/2020   Delivery Method:Vaginal, Spontaneous  Episiotomy: None  Lacerations:  1st degree  Details of delivery can be found in separate delivery note.  Patient had a routine postpartum course. Patient is discharged home 06/11/20.  Newborn Data: Birth date:06/09/2020  Birth time:9:17 PM  Gender:Female  Living status:Living  Apgars:8 ,9  Weight:2356 g  Postpartum Day # 2 : S/P NSVD due to [facility], has been on procardia 30mg  XL daily and will continue to be when she goes home, pt to report s/sx of preE, currently denies HA, RUQ pain or vision changes, no magnesium was indicated during her stay, PCR was 0.28, , pt stable. Patient up ad lib, denies syncope or dizziness. Reports consuming regular diet without issues and denies N/V. Patient reports 0 bowel movement + passing flatus.  Denies issues with urination and reports bleeding is "lighter."  Patient is breast and bottle feeding and reports going well.  Desires declined for postpartum contraception.  Pain is being appropriately managed with use of po meds.     Physical exam  Vitals:   06/10/20 1713 06/10/20 1718 06/10/20 2019 06/11/20 0533  BP: (!) 134/102 (!) 135/109 (!) 125/92 103/72  Pulse: 91 94 89 87  Resp:   18 18  Temp:  97.8 F (36.6 C) 98 F (36.7 C)  TempSrc:   Oral Oral  SpO2:   100% 100%  Weight:       General: alert, cooperative and no distress Lochia: appropriate Uterine Fundus: firm Perineum: Approximate, no hematomas noted DVT Evaluation: No evidence of DVT seen on physical exam. Negative Homan's sign. No cords or calf tenderness. No significant calf/ankle edema.  Labs: Lab Results  Component Value Date   WBC 7.8 06/10/2020   HGB 10.5 (L) 06/10/2020   HCT 34.0 (L) 06/10/2020   MCV 83.7 06/10/2020   PLT 157 06/10/2020   CMP Latest Ref Rng & Units 06/08/2020  Glucose 70 - 99 mg/dL 951(O)  BUN 6 - 20 mg/dL 6  Creatinine 8.41 - 6.60 mg/dL 6.30  Sodium 160 - 109 mmol/L 134(L)  Potassium 3.5  - 5.1 mmol/L 3.6  Chloride 98 - 111 mmol/L 104  CO2 22 - 32 mmol/L 18(L)  Calcium 8.9 - 10.3 mg/dL 9.4  Total Protein 6.5 - 8.1 g/dL 3.2(T)  Total Bilirubin 0.3 - 1.2 mg/dL 0.3  Alkaline Phos 38 - 126 U/L 121  AST 15 - 41 U/L 21  ALT 0 - 44 U/L 18    Date of discharge: 06/11/2020 Discharge Diagnoses: Term Pregnancy-delivered Discharge instruction: per After Visit Summary and "Baby and Me Booklet".  After visit meds:   Activity:           unrestricted and pelvic rest Advance as tolerated. Pelvic rest for 6 weeks.  Diet:                routine Medications: PNV and procardia 30 xlmg  Postpartum contraception: Natural Family Planning Condition:  Pt discharge to home with baby in stable GHNT: Continue procardia 30 mg XL daily, report s/sx and f/u in one weeks for BP check.   Meds: Allergies as of 06/11/2020   No Known Allergies     Medication List    TAKE these medications   ferrous sulfate 325 (65 FE) MG EC tablet Take 325 mg by mouth 3 (three) times daily with meals.   ibuprofen 600 MG tablet Commonly known as: ADVIL Take 1 tablet (600 mg total) by mouth every 6 (six) hours.   NIFEdipine 30 MG 24 hr tablet Commonly known as: ADALAT CC Take 1 tablet (30 mg total) by mouth daily.   PNV PO Take by mouth.       Discharge Follow Up:   Follow-up Information    [facility]. Schedule an appointment as soon as possible for a visit in 1 week(s).   Specialty: Obstetrics and Gynecology Why: 1 week BP check, 6 weeks ppv  Contact information: [address] [place] [address] [phone]               [place], NP-C, CNM 06/11/2020, 9:05 AM  [place], FNP

## 2020-06-11 NOTE — Lactation Note (Signed)
This note was copied from a baby's chart. Lactation Consultation Note  Infant is 64 hours old 37 weeks with a 2% weight loss. Infant has not been able to successfully latch at the breast. Parents are supplementing with Similac Neosure 22 calories variable volumes between 13-40 mls. Adequate output noted on the flow sheet.  Mom is pumping q 3 hrs for at least 15 minutes getting some drops of colostrum.  Mom denies any pain with pumping and states flange size is okay. Provider held d/c to observe for another 24 hours to work on feedings. Mom is also using the breast shells to help elongate her nipples to get a better latch.    LC applied heat, breast massage and tried hand expression, no colostrum noted. Mom given the manual pump on the left breast and she pumped for 10 minutes.  LC then able to latch the baby using 20 NS primed with formula. Infant able to nurse with the 20NS for 15 minutes as per parents. Parents will then supplement infant using the formula based on the LPTI guidelines reviewed with parents.    Parents do not have a DEBP at home. LC to complete a [facility] referral. Parents will probably need a [facility] loaner if they are discharged on 9/19.  Plan 1. Infant to feed based on cues 8-12x in 24 period, no more than 2-3 hours without a feeding attempt.          2. Latch infant at the breast first, then using the 20 NS.            3. Following breastfeeding, supplement with formula following LPTI guidelines 10-20 ml as tolerated            4. Mom to pump using the DEBP q 3hours for 15 minutes.

## 2020-06-12 ENCOUNTER — Ambulatory Visit: Payer: Self-pay

## 2020-06-12 NOTE — Lactation Note (Signed)
This note was copied from a baby's chart. Lactation Consultation Note Baby 41 hrs old. Mom's milk should be coming in soon. Discussed in length engorgement, prevention, pumping, breast massage, milk storage. Mom's breast tender to touch w/massage. Encouraged mom to massage. She got [name] to massage.  Latched baby in football hold. Mom drawing up grimacing in pain. Pumping baby off. Discussed how to unlatch. But mom doesn't she just pushes baby off nipple. Discussed how that can damage nipple more. Mom doesn't appear to be able to tolerate baby suckling on the breast. Discussed pumping and bottle feeding. [name] stated she has to just tolerate the pain. Mom laugh and LC told [name] we don't want it to hurt, and we don't want her to tolerate pain. We want it to be a beautiful experienced and want to nurse her baby. [name] was laughing saying well as long as he gets to eat something.  Mom hadn't pumped since 1900. strongly encouraged mom to pump every 3 hrs and when her milk comes in she may need to pump every 2 1/2 hrs. Discussed how to make hands free bra.  LC would explain something then ask [name] to explain it back to make sure if he really understood what was being said. A lot of clarifying has been made this way.  Baby is jaundice in appearance. On SPT. Spoon fed baby colostrum then bottle fed formula. LC will take purple nipples to see if helpful. Baby took 37 ml in several increments off burping between giving formula. Held baby upright while feeding.  Parents aren't ready to take baby home for BF or pumping as of yet. Mom pumping one breast at a time. Mom pumped 5 ml from Lt. Breast. Pumping Rt. Breast when LC left. Noted mom having increase swelling in axillary area. Discussed BM filling in that area. Mom needs to massage and apply ice if needed.  [name] asked if LC would be able to come again to see mom. Mentioned to call for [facility] assistance, would come if available.    Patient Name: Mia Porter JHERD'E Date: 06/12/2020 Reason for consult: Follow-up assessment;Primapara;Early term 37-38.6wks;Hyperbilirubinemia;Infant < 6lbs   Maternal Data    Feeding Feeding Type: Formula Nipple Type: Slow - flow  LATCH Score Latch: Grasps breast easily, tongue down, lips flanged, rhythmical sucking.  Audible Swallowing: None  Type of Nipple: Flat (semi flat)  Comfort (Breast/Nipple): Filling, red/small blisters or bruises, mild/mod discomfort (breast tender)  Hold (Positioning): Full assist, staff holds infant at breast  LATCH Score: 4  Interventions Interventions: Breast feeding basics reviewed;Assisted with latch;Breast compression;Shells;Skin to skin;Adjust position;Breast massage;Support pillows;DEBP;Position options;Hand express;Expressed milk  Lactation Tools Discussed/Used Tools: Shells;Pump Shell Type: Inverted Breast pump type: Double-Electric Breast Pump Pump Review: Setup, frequency, and cleaning;Milk Storage   Consult Status Consult Status: Follow-up Date: 06/13/20 Follow-up type: In-patient    Charyl Dancer 06/12/2020, 11:31 PM

## 2020-06-12 NOTE — Lactation Note (Addendum)
This note was copied from a baby's chart. Lactation Consultation Note  Patient Name: Boy Riti Rollyson ACZYS'A Date: 06/12/2020   Infant is 75 hrs old. Infant has been primarily bottle-fed. Parents reassured; they understand that some 37 week infants need to be closer to their EDD before doing well at the breast. Initially, Mom said her breasts were soft, but on palpation, there seemed to be some increased breast density. I showed Mom where to palpate & she concurred. Mom reports + breast changes w/pregnancy.   Mom has not yet pumped today, but Dad reports that she pumped about q3-4 hrs yesterday. Per Dad, Mom is concerned about her milk coming to volume. I reassured her. I encouraged her to pump every time infant receives formula, even if she is not getting much yield at this point in time. I noted that she has the herbal galactagogue, Shatavari, at her bedside.   Parents want to rent a [facility] loaner. Paperwork provided. Dad will call for me to return once he has deposit.   Dad was educated on how much of the bottle teat to have in infant's mouth (he only had a portion of it in the baby's mouth). Infant was not showing feeding cues; Dad encouraged to observe feeding cues.   Lurline Hare [facility] 06/12/2020, 8:55 AM

## 2020-06-13 ENCOUNTER — Ambulatory Visit: Payer: Self-pay

## 2020-06-13 LAB — SURGICAL PATHOLOGY

## 2020-06-13 NOTE — Lactation Note (Signed)
This note was copied from a baby's chart. Lactation Consultation Note  Patient Name: Mia Porter [id number]'G Date: 06/13/2020 Reason for consult: Follow-up assessment;Early term 37-38.6wks  Follow up visit with 84 hours old infant with 4.33% weight loss total (infant gained 9 gr overnight). Parents state they are better because infant's total bili is stable at 8.8. Parents explain they have been using formula and also pumping every 3 hours. They have collected 5 mL when pumping. Encouraged parents to feed EBM first and then supplement the rest of volume with formula. Parents state infant has been having good out put, too.  Mother requests help to latch infant to breast. Assisted with pillows for support to football position. Changed infant's and then attempted latch to left breast, football hold. Infant opens wide and latched but does not sustain. Attempted a few more times with same result. Parents share infant last fed 35 mL of formula @0930 .  Encouraged to contact LC for support when ready to breastfeed baby and recommended to request help for questions or concerns.    Maternal Data Does the patient have breastfeeding experience prior to this delivery?: No  Feeding Feeding Type: Breast Fed  LATCH Score Latch: Repeated attempts needed to sustain latch, nipple held in mouth throughout feeding, stimulation needed to elicit sucking reflex.  Audible Swallowing: None  Type of Nipple: Everted at rest and after stimulation  Comfort (Breast/Nipple): Soft / non-tender  Hold (Positioning): Assistance needed to correctly position infant at breast and maintain latch.  LATCH Score: 6  Interventions Interventions: Assisted with latch;Hand express;Adjust position;Support pillows   Consult Status Consult Status: Follow-up Date: 06/14/20 Follow-up type: In-patient    Mia Porter 06/13/2020, 10:12 AM

## 2020-06-14 ENCOUNTER — Ambulatory Visit: Payer: Self-pay

## 2020-06-14 NOTE — Lactation Note (Signed)
This note was copied from a baby's chart. Lactation Consultation Note  Patient Name: Mia Porter [id number] Date: 06/14/2020   Yesterday, Mom pumped q2hrs, but as of 1020has not pumped since 2330).  Mom can pump anywhere from 15 mL - 30 mL. Mom was encouraged to pump around the clock: pump 8 or more times per day/pump or when infant gets formula (OK to skip 1 or 2 sessions to get some sleep).  Mom/baby have WIC office appt tomorrow at 1100am; since baby is getting d/c'd home today, I faxed WIC another referral to see if they could get DEBP today. I encouraged Mom to pump before leaving hospital & to pump both breasts at the same time. Mom is OK with using the hand pump until tomorrow, if [facility] does not get in touch with them today. Mom says she is comfortable with the size 27 flanges/   Infant did better with extra-slow flow nipple than slow-flow nipple. Hand-out provided to parents about comparable nipples that exist on market (created by NICU SLP) Parents wanted to know if I could help with latching, but infant had recently spat up from formula feeding and was now sleeping  Mom was encouraged to pump around the clock: pump 8 or more times per day/pump or when infant gets formula (OK to skip 1 or 2 sessions to get some sleep). I also sent a referral for a outpatient lactation appt.     Lurline Hare [facility] 06/14/2020, 9:36 AM

## 2020-08-20 ENCOUNTER — Other Ambulatory Visit: Payer: Medicaid Other

## 2020-08-20 ENCOUNTER — Other Ambulatory Visit: Payer: Self-pay

## 2020-08-20 DIAGNOSIS — Z20822 Contact with and (suspected) exposure to covid-19: Secondary | ICD-10-CM

## 2020-08-21 LAB — NOVEL CORONAVIRUS, NAA: SARS-CoV-2, NAA: NOT DETECTED

## 2020-08-21 LAB — SARS-COV-2, NAA 2 DAY TAT

## 2020-08-28 IMAGING — US US MFM UA CORD DOPPLER
1 series · 8 of 8 positions shown · non-contrast
Comparison: none

[Series 1: us mfm ua cord doppler · 8 of 8 slices shown]
[im 1/8]
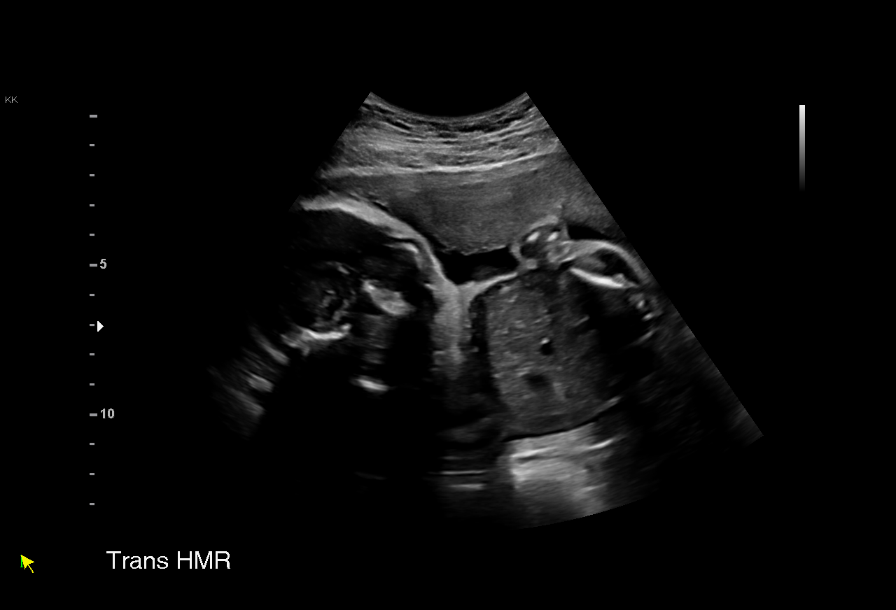
[im 2/8]
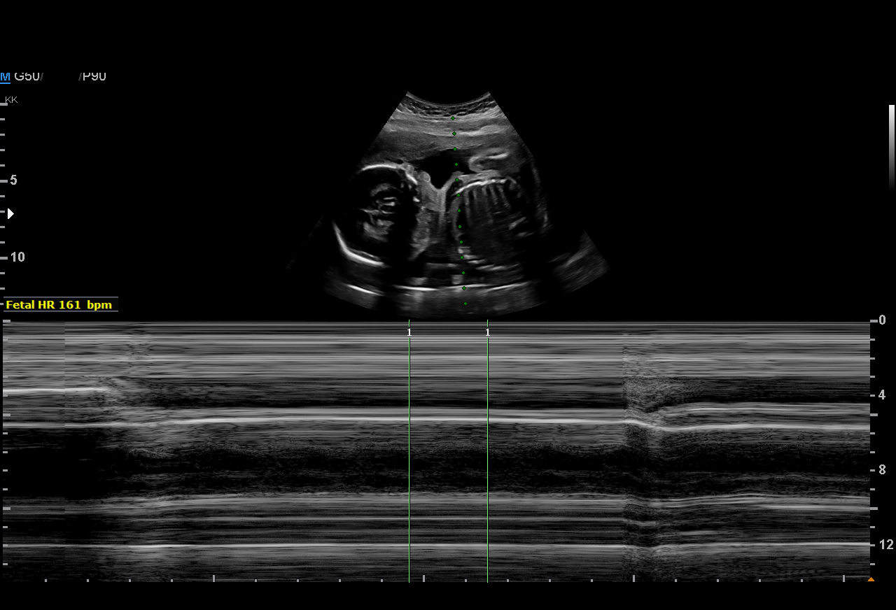
[im 3/8]
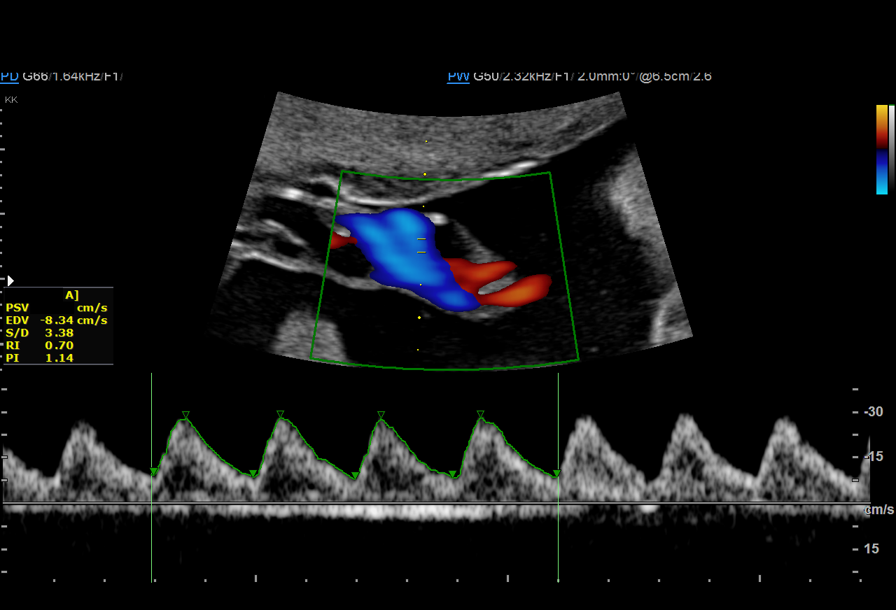
[im 4/8]
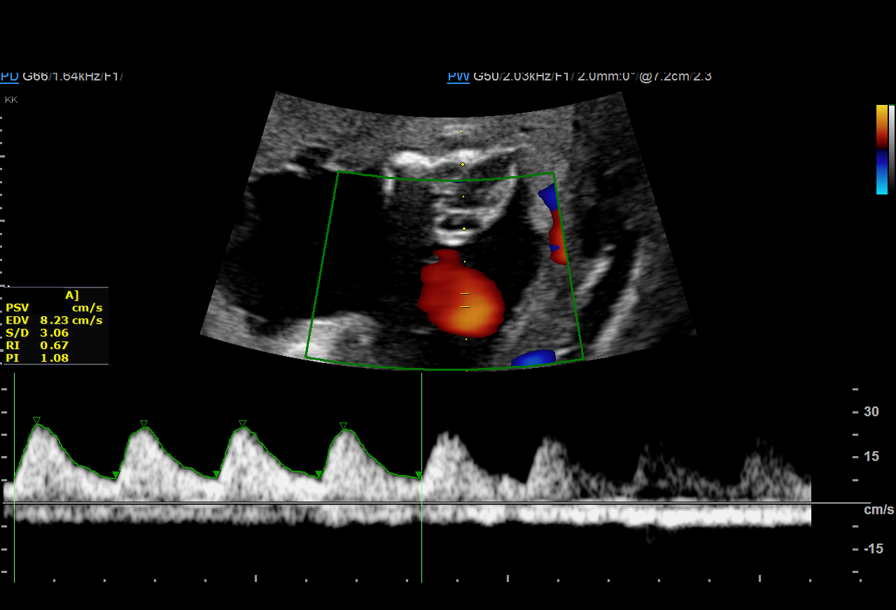
[im 5/8]
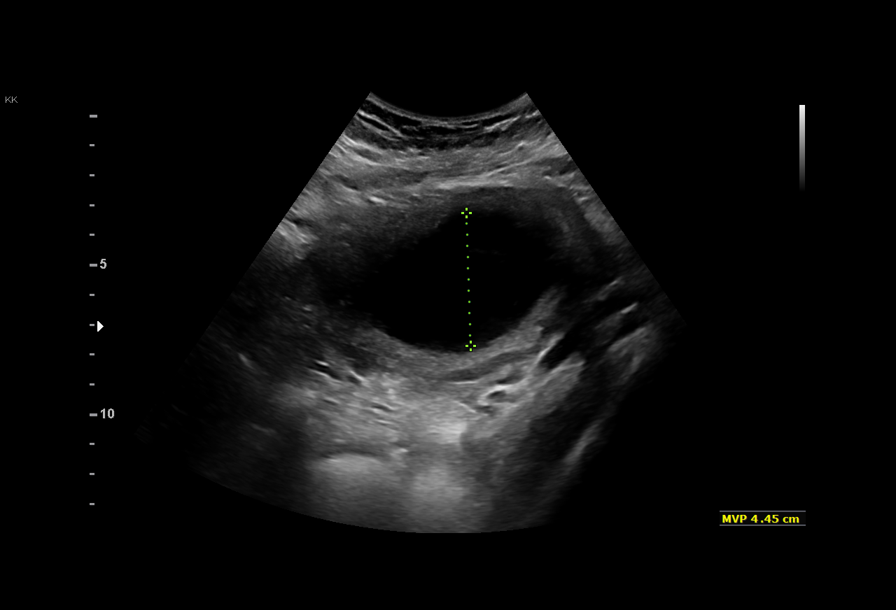
[im 6/8]
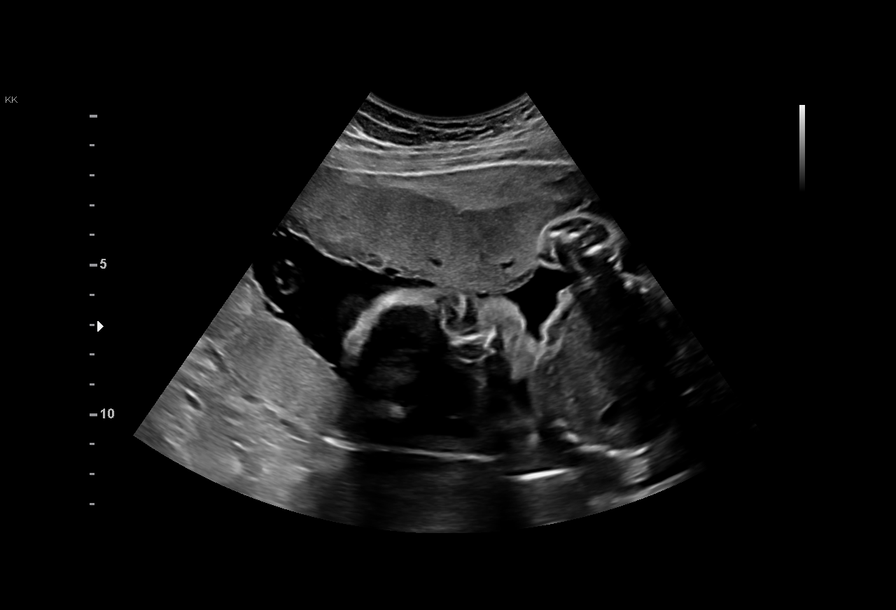
[im 7/8]
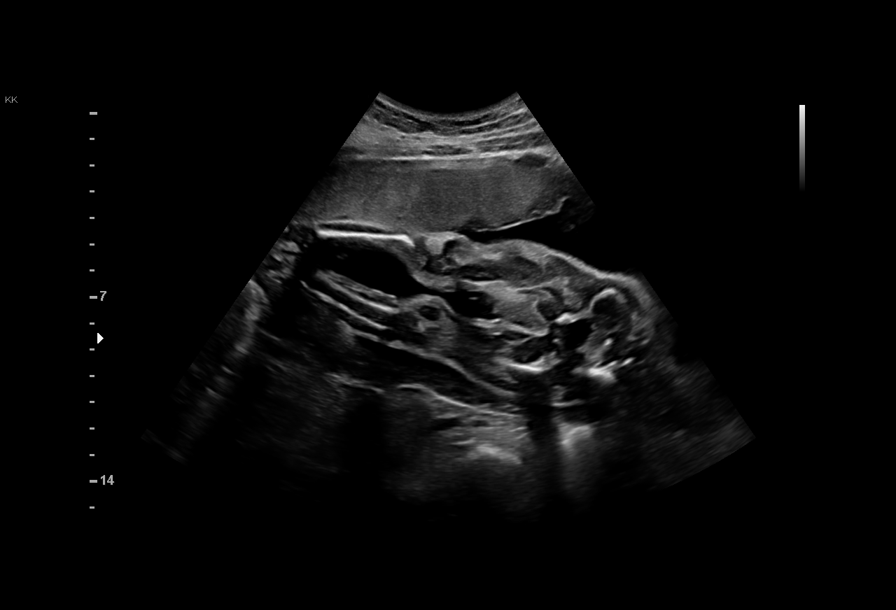
[im 8/8]
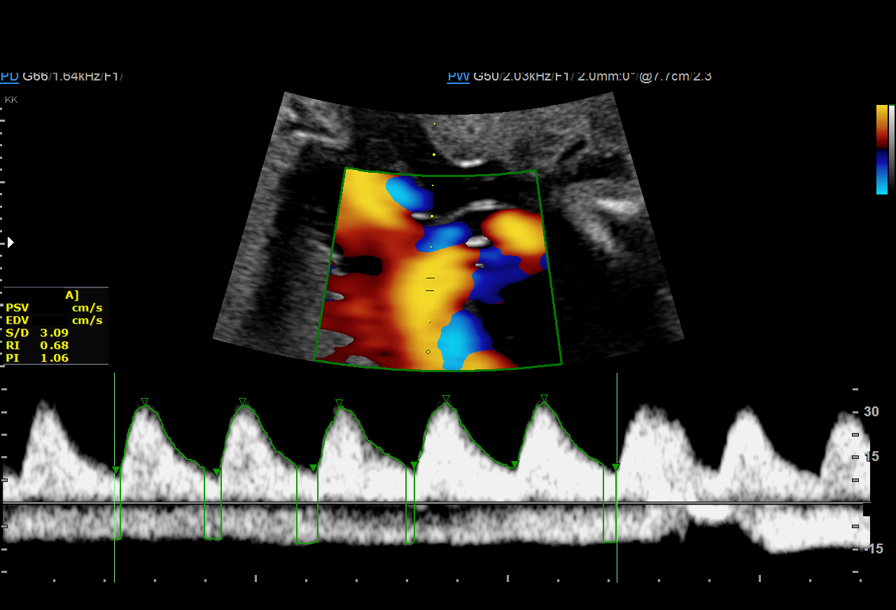

[8 of 8 positions shown; findings below may reference images not displayed]

Obstetrics &
                                                            Gynecology
                                                            [address]
                                                            [place].

Indications

 25 weeks gestation of pregnancy
 Maternal care for known or suspected poor
 fetal growth, second trimester, not applicable
 or unspecified IUGR
 Low Risk NIPS(Negative Horizon)
Fetal Evaluation

 Num Of Fetuses:         1
 Fetal Heart Rate(bpm):  161
 Cardiac Activity:       Observed
 Presentation:           Transverse, head to maternal right

 Amniotic Fluid
 AFI FV:      Within normal limits
OB History

 Gravidity:    1         Term:   0        Prem:   0        SAB:   0
 TOP:          0       Ectopic:  0        Living: 0
Gestational Age

 LMP:           25w 6d        Date:  09/23/19                 EDD:   06/29/20
 Best:          25w 6d     Det. By:  LMP  (09/23/19)          EDD:   06/29/20
Doppler - Fetal Vessels

 Umbilical Artery
  S/D     %tile      RI    %tile      PI    %tile            ADFV    RDFV
  3.17       46    0.68       49    1.09       55               No      No

Impression

 Patient with suspected fetal growth restriction return for
 umbilical artery Doppler studies.

 Amniotic fluid is normal and good fetal activity seen.
 Transverse lie and head to maternal right.  Umbilical artery
 Doppler showed normal forward diastolic flow.

 I reassured the couple of the findings.  I counseled that
 frequent antenatal testing is being performed because of
 suspected fetal growth restriction.  She is also likely to carry a
 constitutionally small and a healthy baby.
Recommendations

 -Fetal growth assessment and umbilical artery Doppler study
 next week.
                 [place], Lola Hassen
